# Patient Record
Sex: Female | Born: 1975
Health system: Southern US, Community
[De-identification: ages and names within clinical notes are randomized; demographics above are authoritative.]

## PROBLEM LIST (undated history)

## (undated) DIAGNOSIS — Z8619 Personal history of other infectious and parasitic diseases: Secondary | ICD-10-CM

## (undated) DIAGNOSIS — D649 Anemia, unspecified: Secondary | ICD-10-CM

## (undated) DIAGNOSIS — B3731 Acute candidiasis of vulva and vagina: Secondary | ICD-10-CM

## (undated) DIAGNOSIS — N870 Mild cervical dysplasia: Secondary | ICD-10-CM

## (undated) DIAGNOSIS — N3281 Overactive bladder: Secondary | ICD-10-CM

## (undated) DIAGNOSIS — Z5189 Encounter for other specified aftercare: Secondary | ICD-10-CM

## (undated) DIAGNOSIS — B373 Candidiasis of vulva and vagina: Secondary | ICD-10-CM

## (undated) HISTORY — DX: Acute candidiasis of vulva and vagina: B37.31

## (undated) HISTORY — DX: Anemia, unspecified: D64.9

## (undated) HISTORY — DX: Mild cervical dysplasia: N87.0

## (undated) HISTORY — DX: Candidiasis of vulva and vagina: B37.3

## (undated) HISTORY — DX: Overactive bladder: N32.81

## (undated) HISTORY — PX: OTHER SURGICAL HISTORY: SHX169

## (undated) HISTORY — DX: Personal history of other infectious and parasitic diseases: Z86.19

---

## 2002-09-18 ENCOUNTER — Other Ambulatory Visit: Admission: RE | Admit: 2002-09-18 | Discharge: 2002-09-18 | Payer: Self-pay | Admitting: Obstetrics and Gynecology

## 2003-10-16 ENCOUNTER — Other Ambulatory Visit: Admission: RE | Admit: 2003-10-16 | Discharge: 2003-10-16 | Payer: Self-pay | Admitting: Obstetrics and Gynecology

## 2004-02-03 ENCOUNTER — Emergency Department (HOSPITAL_COMMUNITY): Admission: EM | Admit: 2004-02-03 | Discharge: 2004-02-03 | Payer: Self-pay | Admitting: Emergency Medicine

## 2004-12-09 ENCOUNTER — Other Ambulatory Visit: Admission: RE | Admit: 2004-12-09 | Discharge: 2004-12-09 | Payer: Self-pay | Admitting: Obstetrics and Gynecology

## 2005-12-13 ENCOUNTER — Other Ambulatory Visit: Admission: RE | Admit: 2005-12-13 | Discharge: 2005-12-13 | Payer: Self-pay | Admitting: Obstetrics & Gynecology

## 2007-10-04 ENCOUNTER — Other Ambulatory Visit: Admission: RE | Admit: 2007-10-04 | Discharge: 2007-10-04 | Payer: Self-pay | Admitting: Obstetrics & Gynecology

## 2008-04-04 DIAGNOSIS — Z5189 Encounter for other specified aftercare: Secondary | ICD-10-CM

## 2008-04-04 DIAGNOSIS — IMO0001 Reserved for inherently not codable concepts without codable children: Secondary | ICD-10-CM

## 2008-04-04 HISTORY — DX: Encounter for other specified aftercare: Z51.89

## 2008-04-04 HISTORY — DX: Reserved for inherently not codable concepts without codable children: IMO0001

## 2008-04-17 ENCOUNTER — Inpatient Hospital Stay (HOSPITAL_COMMUNITY): Admission: AD | Admit: 2008-04-17 | Discharge: 2008-04-17 | Payer: Self-pay | Admitting: Obstetrics and Gynecology

## 2008-08-28 ENCOUNTER — Inpatient Hospital Stay (HOSPITAL_COMMUNITY): Admission: AD | Admit: 2008-08-28 | Discharge: 2008-08-28 | Payer: Self-pay | Admitting: Obstetrics and Gynecology

## 2008-11-17 ENCOUNTER — Inpatient Hospital Stay (HOSPITAL_COMMUNITY): Admission: RE | Admit: 2008-11-17 | Discharge: 2008-11-20 | Payer: Self-pay | Admitting: Obstetrics and Gynecology

## 2010-07-10 LAB — CBC
HCT: 20.2 % — ABNORMAL LOW (ref 36.0–46.0)
HCT: 31.9 % — ABNORMAL LOW (ref 36.0–46.0)
Hemoglobin: 6.9 g/dL — CL (ref 12.0–15.0)
MCHC: 34 g/dL (ref 30.0–36.0)
MCHC: 34.2 g/dL (ref 30.0–36.0)
MCHC: 34.5 g/dL (ref 30.0–36.0)
MCHC: 34.5 g/dL (ref 30.0–36.0)
MCV: 94.8 fL (ref 78.0–100.0)
Platelets: 112 10*3/uL — ABNORMAL LOW (ref 150–400)
Platelets: 133 10*3/uL — ABNORMAL LOW (ref 150–400)
Platelets: 96 10*3/uL — ABNORMAL LOW (ref 150–400)
RBC: 3.44 MIL/uL — ABNORMAL LOW (ref 3.87–5.11)
RDW: 15.1 % (ref 11.5–15.5)
RDW: 15.3 % (ref 11.5–15.5)
RDW: 15.4 % (ref 11.5–15.5)
RDW: 15.8 % — ABNORMAL HIGH (ref 11.5–15.5)
RDW: 16 % — ABNORMAL HIGH (ref 11.5–15.5)
WBC: 7.9 10*3/uL (ref 4.0–10.5)

## 2010-07-10 LAB — CROSSMATCH
ABO/RH(D): A NEG
Antibody Screen: POSITIVE

## 2010-07-10 LAB — RH IMMUNE GLOB WKUP(>/=20WKS)(NOT WOMEN'S HOSP): Fetal Screen: NEGATIVE

## 2010-07-10 LAB — RPR: RPR Ser Ql: NONREACTIVE

## 2010-07-13 LAB — RH IMMUNE GLOBULIN WORKUP (NOT WOMEN'S HOSP): ABO/RH(D): A NEG

## 2010-07-19 LAB — RH IMMUNE GLOBULIN WORKUP (NOT WOMEN'S HOSP): Antibody Screen: NEGATIVE

## 2010-08-17 NOTE — H&P (Signed)
[name], [name]              ACCOUNT NO.:  [id number]   MEDICAL RECORD NO.:  [id number]          PATIENT TYPE:  INP   LOCATION:  9164                          [facility]   PHYSICIAN:  [name], Jade.D. DATE OF BIRTH:  04-14-1975   DATE OF ADMISSION:  11/17/2008  DATE OF DISCHARGE:                              HISTORY & PHYSICAL   Ms. Barnes is a 35 year old gravida 2, para 0-0-1-0, at 41-1/7, weeks  who presents for induction secondary to post dates.  Her pregnancy has  been remarkable for:  1. Rh negative.  2. First trimester spotting.  3. Anemia.  4. First trimester UTI.  5. Group B strep negative.   PRENATAL LABORATORY DATA:  Blood type is A negative, Rh antibody is  positive for anti-D.  RPR was nonreactive.  Rubella titer was immune.  Hepatitis B surface antigen was negative.  HIV was nonreactive.  GC,  chlamydia cultures were negative in July.  Pap was normal in July.  Hemoglobin upon entering the practice was 11.7.  It was within normal  limits at 28 weeks.  Her Glucola was normal.  The patient declined  genetic screening.  She had a normal Glucola.  She had negative group B  strep culture and negative GC and chlamydia cultures at 37 weeks.   HISTORY OF PRESENT PREGNANCY:  The patient entered care at approximately  13 weeks.  She had an ultrasound in the first trimester for dating  purposes.  That was in maternity admissions.  She also received RhoGAM  at that time in January for first-trimester spotting.  She declined  genetic screening.  She was treated prophylactically with Macrobid for  some urinary symptoms but the culture was negative.  At 20 weeks she had  an ultrasound showing normal growth and development.  Glucola was given  at 28 weeks and was normal.  She also received RhoGAM at approximately  29 weeks.  She had some spotting at 31 weeks.  This was just subsequent  to her dose of RhoGAM.  She was treated for BV at that time.  Pap,  cultures and  beta strep were done at 36 weeks.  All these were within  normal limits.  The patient was seen in the office on August 13 for a  return visit.  She had discussed induction with [name] 2 days  prior to that and had been scheduled at 41-1/7 weeks.  No slots were  available in the evening for cervical ripening; therefore, she had to be  scheduled of a morning time on August 16.  At the time of her visit on  August 13, her cervix was very posterior, 1 cm about, 70%, and the  vertex was at a -1 station.  Cervix, however, was very firm.   OBSTETRICAL HISTORY:  In 1999 she had a termination of pregnancy.   MEDICAL HISTORY:  She has received RhoGAM with that in the past.  She is  A negative.  She was on Loestrin 24, which she stopped November 2008.  She reports usual childhood illnesses.  She has a history  of anemia and  was on an iron supplement.  She has had no surgeries other than the  previously-noted TAB.   ALLERGIES:  She has no known medication allergies.   FAMILY HISTORY:  Maternal grandmother had a heart attack.  Paternal  grandmother had congestive heart failure.  Paternal grandmother had  emphysema.  Paternal grandfather and maternal grandfather had diabetes.  Paternal grandfather had 2 strokes.  Paternal uncle has leukemia.   GENETIC HISTORY:  Unremarkable.   SOCIAL HISTORY:  The patient is married to the father of the baby.  He  is involved and supportive.  His name is [name].  The patient is  Caucasian, of the [place] faith.  She has a bachelor's degree.  She is  an Education officer, community.  Her husband has a bachelor's degree.  He is a  Therapist, music.  She has been followed by the certified nurse midwife  service of [place].  She denies any alcohol, drug or tobacco  use during this pregnancy.   PHYSICAL EXAMINATION:  VITAL SIGNS: Stable.  The patient is febrile.  HEENT:  Within normal limits.  LUNGS:  Bilateral breath sounds are clear.  HEART:   Regular rate and rhythm without murmur.  BREASTS:  Soft and nontender.  ABDOMEN:  Fundal height is approximately 39 cm.  Estimated fetal weight  is 7-8 pounds.  Uterine contractions are and have been approximately  every 5-7 minutes, mild quality.  Cervical exam on last evaluation was posterior, 1, 75%, vertex at a -1  station, but the cervix was very posterior and very firm.  Fetal heart rate was reactive on the tracing on August 13.  EXTREMITIES:  Deep tendon reflexes are 2+ without clonus.  There is  trace edema noted.   IMPRESSION:  1. Intrauterine pregnancy at 41-1/7 weeks.  2. Post dates.  3. Negative group B strep.  4. Unfavorable cervix.  5. Rh negative.   PLAN:  1. Admit to birthing suite per consult with [name] as      attending physician.  2. Routine certified nurse midwife orders.  3. We will plan cervical ripening today.  The patient understands that      this may be an issue of serial induction requiring cervical      preparation before starting Pitocin.  The patient also seems to      understand, has also had explained to her the issues of induction      which may cause it to be a prolonged labor, failure of method, need      for cesarean section.  The patient and her husband seem to      understand these risks and wish to proceed.  4. We will plan pain medication as labor advances p.r.n.      [name], C.N.Jade.      [name], Jade.D.  Electronically Signed    VLL/MEDQ  D:  11/15/2008  T:  11/15/2008  Job:  [id number]

## 2010-08-17 NOTE — Discharge Summary (Signed)
[name], [name]              ACCOUNT NO.:  [id number]   MEDICAL RECORD NO.:  [id number]          PATIENT TYPE:  INP   LOCATION:  9146                          [facility]   PHYSICIAN:  Crist Fat. Rivard, M.D. DATE OF BIRTH:  07-04-1975   DATE OF ADMISSION:  11/17/2008  DATE OF DISCHARGE:  11/20/2008                               DISCHARGE SUMMARY   ADMITTING DIAGNOSES:  1. Intrauterine pregnancy at 41 weeks.  2. Rh negative.  3. History of mild anemia.   DISCHARGE DIAGNOSES:  1. Intrauterine pregnancy at 41 weeks.  2. Retained placenta.  3. Postpartum anemia with hemodynamic changes.  4. Mild thrombocytopenia.   PROCEDURES:  1. Normal spontaneous vaginal birth.  2. Repair of first-degree laceration.  3. Vigorous fundal massage for retained placenta.  4. Transfusion of 2 units packed red blood cells.   HOSPITAL COURSE:  [name] is a 35 year old gravida 2, para 0-0-1-0 at  41 weeks who presented for induction of labor.  On the morning of November 17, 2008, the patient's pregnancy had been remarkable for  1. Rh negative.  2. First trimester spotting  3. History of anemia.  4. UTI.   The patient's hemoglobin was 11.7 at her new OB visit, it was 10.9 on  the day of admission.  On admission, hemoglobin was 10.9, hematocrit was  31.9, and platelet count was 112.  On the day of admission, plan was  made for cervical ripening secondary to cervix being fairly unfavorable  with cervix at a fingertip 1 cm 50% vertex -2.  Cytotec was placed at  approximately 10:30 a.m. on that morning it was maintained until 10:30  that night.  At that time her cervix was posterior fingertip to 160%  vertex -2.  The decision was made to run Pitocin low-dose subsequently  through the night and then it began increasing at 7:00 a.m.  The patient  began to get more uncomfortable as the night progressed.  Cervix by 2:00  a.m. was 3.5 cm, 90% vertex at -1 station.  Epidural was placed at 4 or  5  a.m.  Cervix was 5.5, 80% vertex at -2.  She had some moderate-to-  large amount of bright red blood at that time.  There were some mild  variables with fetal heart rate was overall reassuring.  Artificial  rupture of membranes was deferred at that time.  It was performed by  6:00 a.m. when cervix was 60-90% vertex at -1.  Fetal heart rate was  again overall reassuring.  There were some mild variables noted.  The  patient progressed to 9 cm at 9:00 a.m., was completely dilated at 9:30  a.m. with vertex at 0 to +1 station.  She labored down until  approximately 12:10, at which time she began to push.  Over the next  hour, she made good progress.  She did have a low-grade temp of 100.2.  The patient accomplished a spontaneous vaginal delivery at 3:07 p.m. of  a female infant, [name], weight 7 pounds 5 ounces.  Apgars were 8 and 9  from a ROP position  with three loops of cord.  Placenta was noted after  1 hour to be retained.  Bleeding was minimal.  Dr. Su Hilt was notified  at 5:05 p.m.  Fundus was just above umbilicus, was firm.  There was no  active bleeding.  Placenta was then removed via vigorous fundal massage.  Estimated blood loss was approximately 1000 mL.  Fundus was firm,  subsequently that her IV was continued.  First-degree laceration was  repaired under local anesthesia.  She was transferred to [facility]  and was returned to essentially routine care.  By the next morning, she  was noting some dizziness and some significant fatigue.  Orthostatics  were done showing positive orthostatic pulse changes, but negative blood  pressure changes.  Hemoglobin on day one was 6.9, which is down from  11.1, platelet count was 96 down from 118, blood transfusion was offered  and reviewed with the patient.  At that time she declined it.  She  continued to have no syncopal episodes and her dizziness resolved.  However, by the morning of November 20, 2008, she was complaining of  extreme fatigue  and increased heart rate with activity.  She was breast-  feeding, but then decided to move to bottle feeding.  Her pulse was 97,  blood pressure is 106/68.  She was appearing pale.  Hemoglobin on day 2  was 6.2, hematocrit was 18, white blood cell count was 11.7, and  platelet count was 112.  Again blood transfusion was offered with risks  and benefits reviewed with the patient, her husband, both by Dr. Estanislado Pandy,  as well as myself.  Patient did elect to proceed with transfusion, 2  units of packed red blood cells were given to the patient with  premedication.  H and H was done 4 hours subsequent to the last unit and  her hemoglobin was 8.5.  Hematocrit was 24.2 and platelet count was 133.  The patient was feeling much better.  She was up ad lib without any  syncope, dizziness, significant excessive fatigue, or tachycardia.  She  did wish to be discharged home on the evening of August 19.  Dr. Estanislado Pandy  and Dr. Su Hilt were consulted due to some cross coverage, both of them  okay, discharged as long as the patient was feeling stable and which she  was.  The patient was then discharged home in stable condition.   DISCHARGE INSTRUCTIONS:  Per [facility] handout, bleeding  precautions were also reviewed with the patient.   DISCHARGE MEDICATIONS:  1. Motrin 600 mg p.o. q.6 h p.r.n. pain.  2. Percocet 5/325 one to two p.o. q.3-4 h p.r.n. pain.  3. Integra F one p.o. daily.   Discharge followup occur in 6 weeks [facility].  The patient  plans [name] at her postpartum visit.      Renaldo Reel Emilee Hero, C.N.M.      Crist Fat Rivard, M.D.  Electronically Signed    VLL/MEDQ  D:  11/20/2008  T:  11/21/2008  Job:  [id number]

## 2010-08-17 NOTE — H&P (Signed)
[name], [name]              ACCOUNT NO.:  [id number]   MEDICAL RECORD NO.:  [id number]          PATIENT TYPE:  INP   LOCATION:  9164                          [facility]   PHYSICIAN:  Naima A. Dillard, M.D. DATE OF BIRTH:  17-Aug-1975   DATE OF ADMISSION:  11/17/2008  DATE OF DISCHARGE:                              HISTORY & PHYSICAL   ADMISSION DIAGNOSES:  Intrauterine pregnancy at 41 weeks, induction of  labor per the patient's request.  She is Rh negative, history of anemia,  and she is a primigravida.  A 35 year old gravida 2, para 1, AB 1  presents to labor and delivery for induction of labor for postdate,  presented to [facility] at 13-3/7th weeks for beginning of  prenatal care.  She is taking vitamins.  Ultrasound equals dating the  dates.  Placenta was anterior.  RhoGAM was given early in the pregnancy  due to spotting in the first trimester, as well as 30 weeks in MAU due  to Rh negative.  She had a history of an urinary tract infection with  this pregnancy, which was treated with Macrobid, presents to the office,  8/11, requesting to be induced due to work issues and no insurance/time  off, scheduled for November 17, 2008, at 41 weeks was told to do fetal  kick counts and return to labor and delivery if labor ensues.  The  patient and husband were very adamant about having an induction after  prolonged conversations about the risks of induction including  postpartum hemorrhage or C-section and states that they understand that  this could be a possibility and wants to proceed with the induction on  Monday.   LABORATORY DATA:  She is A negative, positive antibody screen.  She is a  A negative antibody immune, VDRL nonreactive, rubella immune, HBAg  negative, HIV nonreactive, Pap negative.  CTGC and CT were negative and  GBS was negative.   PAST MEDICAL HISTORY:  She is A negative.  She had spotting in the first  trimester and she was given RhoGAM.   Chickenpox as a child, history of  anemia.  Denies asthma, epilepsy, or heart disease.   PAST SURGICAL HISTORY:  Negative.   PAST OBSTETRICAL AND GYNECOLOGICAL HISTORY:  Menarche at 35 years old  with irregular cycles from 7-12 days.  She was on birth control pills to  regulate her periods.  Her last menstrual period was January 07, 2008,  which equals an [facility] of 8 per dates and ultrasound.  In 1999, she had  elective AB.   SOCIAL HISTORY:  The patient works as activities assistance full time.  She has her bachelor's degree.  She is married to her husband, Casimiro Needle  who works as a Therapist, music.  He has a bachelor's degree.  Husband as 2  sons by previous marriage.  The patient denies smoking, alcohol, or  drugs.   FAMILY HISTORY:  Maternal grandmother has emphysema.  Paternal  grandfather had two strokes, paternal uncle has leukemia, maternal  grandmother has heart disease, and congestive heart failure.   REVIEW OF SYSTEMS:  At office visit denies headaches, epigastric pain,  baby moving.  HEART:  Regular rate without murmur.  LUNGS:  Clear  bilaterally.  ABDOMEN:  Gravid.  Estimated fetal weight 7-1/2 to 8  pounds.  VAGINAL EXAM:  Posterior cervix, -1 to -2 vertex, negative  Homans.  Reflexes were +1.   ASSESSMENT:  Intrauterine pregnancy at 41 weeks, induction of labor for  postdates.  Rh negative, GBS negative.   PLAN:  Admit to labor and delivery for induction per protocol.  Protocols were faxed for induction.  Bishop score was 6.  Dr. Normand Sloop  was notified per voice mail as she is the doctor on call, as well as a  Statistician.      [name], CNM      Naima A. Normand Sloop, M.D.  Electronically Signed    JM/MEDQ  D:  11/14/2008  T:  11/15/2008  Job:  [id number]

## 2010-12-10 ENCOUNTER — Other Ambulatory Visit: Payer: Self-pay | Admitting: Obstetrics and Gynecology

## 2010-12-13 ENCOUNTER — Encounter (HOSPITAL_COMMUNITY): Payer: Self-pay | Admitting: *Deleted

## 2010-12-14 NOTE — H&P (Signed)
NAME:  [name], [name] NO.:  [id number]  MEDICAL RECORD NO.:  [id number]  LOCATION:  PERIO                         [facility]ILITY[facility]  PHYSICIAN:  Osborn Coho, M.D.   DATE OF BIRTH:  1975-08-31  DATE OF ADMISSION:  12/10/2010 DATE OF DISCHARGE:                             HISTORY & PHYSICAL   HISTORY OF PRESENT ILLNESS:  Ms. Kramme is a 36 year old married white female, para 1-0-1-1 presenting for cold knife conization of her cervix because of adenocarcinoma in situ and CIN-I .  The patient gives a history of normal Pap smears until August 2012 when her Pap smear returned showing epithelial cell abnormality with glandular cells and endocervical adenocarcinoma in situ.  A subsequent colposcopy returned with biopsy results showing  CIN-I with focal fragments of atypical endocervical epithelium consistent with endocervical adenocarcinoma in situ.  The patient denies any intermenstrual bleeding, postcoital bleeding, vaginitis symptoms, or abnormal menstrual cycle.  The patient's menstrual flow lasts for 3 days requiring only two pads per day and she denies any cramping.  Given the current findings on colposcopy/biopsy, the patient has consented to proceed with cold knife conization of her cervix.  PAST MEDICAL HISTORY:  OB history:  Gravida 2, para 1-0-1-1.  The patient had a spontaneous vaginal birth in 2010 for which she underwent a blood transfusion.  GYN history:  Menarche 35 years old.  Last menstrual period on November 17, 2010.  She uses Loestrin 24 as her method of contraception.  She does have recently diagnosed human papillomavirus.  Her Pap smears have been normal until recently - see history of present illness.  Medical history:  Anemia.  Surgical history:  Negative.  FAMILY HISTORY:  Cardiovascular disease, diabetes mellitus, emphysema, stroke, and leukemia.  SOCIAL HISTORY:  The patient is married and she works as an Teacher, English as a foreign language at  [name].  HABITS:  She does not use tobacco, alcohol, or illicit drugs.  CURRENT MEDICATIONS:  Multivitamins daily, Loestrin 24 Fe daily.  ALLERGIES:  She has no known drug allergies.  She denies any sensitivities to latex, peanuts, shellfish, or soy  REVIEW OF SYSTEMS:  The patient does wear glasses.  She denies any headache, vision changes, tinnitus, difficulty swallowing, chronic cough, chest pain, shortness of breath, nausea, vomiting, diarrhea, constipation, dysuria, hematuria, urinary urgency, urinary frequency, vaginitis symptoms, myalgias, arthralgia, or night sweats.  PHYSICAL EXAMINATION:  VITAL SIGNS:  Blood pressure 118/70, pulse is 72, respirations 16, temperature 98.5 degrees Fahrenheit orally, weight is 130 pounds, and height 5 feet and 5-1/4 inches tall.  Body mass index is 22.5. NECK:  Supple without masses.  There is no thyromegaly or cervical adenopathy. HEART:  Regular rate and rhythm. LUNGS:  Clear. BACK:  No CVA tenderness. ABDOMEN:  No tenderness, guarding, rebound, masses, or organomegaly. EXTREMITIES:  No clubbing, cyanosis, or edema. PELVIC:  EGBUS is normal.  Vagina is normal.  Cervix is nontender without lesions.  Uterus appears normal size, shape, and consistency without tenderness.  Adnexa without tenderness or masses.  IMPRESSION: 1. Cervical adenocarcinoma in situ. 2. CIN-I.  DISPOSITION:  A discussion was held with the patient regarding the indications for her procedure along with its risks  which include but are not limited to reaction to anesthesia, damage to adjacent organs, infection, excessive bleeding, cervical incompetence, and cervical stenosis.  The patient verbalized understanding of these risks and has consented to proceed with a cold knife conization on December 15, 2010, at 9 o'clock a.m. at [facility].     Rahsaan Weakland J. Lowell Guitar, P.A.-C.   ______________________________ Osborn Coho,  M.D.    EJP/MEDQ  D:  12/13/2010  T:  12/14/2010  Job:  [id number]

## 2010-12-15 ENCOUNTER — Encounter (HOSPITAL_COMMUNITY): Payer: Self-pay | Admitting: Anesthesiology

## 2010-12-15 ENCOUNTER — Ambulatory Visit (HOSPITAL_COMMUNITY): Payer: PRIVATE HEALTH INSURANCE | Admitting: Anesthesiology

## 2010-12-15 ENCOUNTER — Other Ambulatory Visit: Payer: Self-pay | Admitting: Obstetrics and Gynecology

## 2010-12-15 ENCOUNTER — Ambulatory Visit (HOSPITAL_COMMUNITY)
Admission: RE | Admit: 2010-12-15 | Discharge: 2010-12-15 | Disposition: A | Discharge status: Home / Self Care | Payer: PRIVATE HEALTH INSURANCE | Source: Ambulatory Visit | Attending: Obstetrics and Gynecology | Admitting: Obstetrics and Gynecology

## 2010-12-15 ENCOUNTER — Encounter (HOSPITAL_COMMUNITY)
Admission: RE | Disposition: A | Discharge status: Home / Self Care | Payer: Self-pay | Source: Ambulatory Visit | Attending: Obstetrics and Gynecology

## 2010-12-15 DIAGNOSIS — D069 Carcinoma in situ of cervix, unspecified: Secondary | ICD-10-CM | POA: Insufficient documentation

## 2010-12-15 HISTORY — DX: Encounter for other specified aftercare: Z51.89

## 2010-12-15 HISTORY — PX: CERVICAL CONIZATION W/BX: SHX1330

## 2010-12-15 LAB — CBC
HCT: 39 % (ref 36.0–46.0)
MCHC: 34.1 g/dL (ref 30.0–36.0)
RDW: 14 % (ref 11.5–15.5)

## 2010-12-15 SURGERY — CONE BIOPSY, CERVIX
Anesthesia: Choice | Site: Vagina | Wound class: Clean Contaminated

## 2010-12-15 MED ORDER — KETOROLAC TROMETHAMINE 30 MG/ML IJ SOLN
INTRAMUSCULAR | Status: AC
  Filled 2010-12-15: qty 1

## 2010-12-15 MED ORDER — MIDAZOLAM HCL 2 MG/2ML IJ SOLN
INTRAMUSCULAR | Status: AC
  Filled 2010-12-15: qty 2

## 2010-12-15 MED ORDER — DEXAMETHASONE SODIUM PHOSPHATE 4 MG/ML IJ SOLN
INTRAMUSCULAR | Status: DC | PRN
  Administered 2010-12-15: 10 mg via INTRAVENOUS

## 2010-12-15 MED ORDER — VASOPRESSIN 20 UNIT/ML IJ SOLN
INTRAVENOUS | Status: DC | PRN
  Administered 2010-12-15: 10:00:00 via INTRAMUSCULAR

## 2010-12-15 MED ORDER — LIDOCAINE HCL (CARDIAC) 20 MG/ML IV SOLN
INTRAVENOUS | Status: DC | PRN
  Administered 2010-12-15: 50 mg via INTRAVENOUS

## 2010-12-15 MED ORDER — LACTATED RINGERS IV SOLN
INTRAVENOUS | Status: DC
  Administered 2010-12-15: 10:00:00 via INTRAVENOUS
  Administered 2010-12-15: 125 mL/h via INTRAVENOUS
  Administered 2010-12-15: 08:00:00 via INTRAVENOUS

## 2010-12-15 MED ORDER — ONDANSETRON HCL 4 MG/2ML IJ SOLN
INTRAMUSCULAR | Status: DC | PRN
  Administered 2010-12-15: 4 mg via INTRAVENOUS

## 2010-12-15 MED ORDER — HYDROCODONE-ACETAMINOPHEN 5-325 MG PO TABS: 1.0000 | ORAL_TABLET | Freq: Once | ORAL | Status: DC

## 2010-12-15 MED ORDER — PROPOFOL 10 MG/ML IV EMUL
INTRAVENOUS | Status: AC
  Filled 2010-12-15: qty 20

## 2010-12-15 MED ORDER — HYDROCODONE-ACETAMINOPHEN 5-325 MG PO TABS
ORAL_TABLET | ORAL | Status: AC
  Administered 2010-12-15: 1
  Filled 2010-12-15: qty 1

## 2010-12-15 MED ORDER — LIDOCAINE HCL 1 % IJ SOLN
INTRAMUSCULAR | Status: DC | PRN
  Administered 2010-12-15: 10 mL

## 2010-12-15 MED ORDER — GLYCOPYRROLATE 0.2 MG/ML IJ SOLN
INTRAMUSCULAR | Status: AC
  Filled 2010-12-15: qty 1

## 2010-12-15 MED ORDER — FENTANYL CITRATE 0.05 MG/ML IJ SOLN
INTRAMUSCULAR | Status: AC
  Filled 2010-12-15: qty 5

## 2010-12-15 MED ORDER — PROPOFOL 10 MG/ML IV EMUL
INTRAVENOUS | Status: DC | PRN
  Administered 2010-12-15: 150 mg via INTRAVENOUS

## 2010-12-15 MED ORDER — ONDANSETRON HCL 4 MG/2ML IJ SOLN
INTRAMUSCULAR | Status: AC
  Filled 2010-12-15: qty 2

## 2010-12-15 MED ORDER — FENTANYL CITRATE 0.05 MG/ML IJ SOLN
INTRAMUSCULAR | Status: AC
  Filled 2010-12-15: qty 2

## 2010-12-15 MED ORDER — IBUPROFEN 600 MG PO TABS: 600.0000 mg | ORAL_TABLET | Freq: Four times a day (QID) | ORAL | Status: AC | PRN

## 2010-12-15 MED ORDER — HYDROCODONE-ACETAMINOPHEN 5-500 MG PO TABS: 1.0000 | ORAL_TABLET | Freq: Four times a day (QID) | ORAL | Status: AC | PRN

## 2010-12-15 MED ORDER — ACETIC ACID 4% SOLUTION
Status: DC | PRN
  Administered 2010-12-15: 1 via TOPICAL

## 2010-12-15 MED ORDER — DEXAMETHASONE SODIUM PHOSPHATE 10 MG/ML IJ SOLN
INTRAMUSCULAR | Status: AC
  Filled 2010-12-15: qty 1

## 2010-12-15 MED ORDER — GLYCOPYRROLATE 0.2 MG/ML IJ SOLN
INTRAMUSCULAR | Status: DC | PRN
  Administered 2010-12-15: 0.2 mg via INTRAVENOUS

## 2010-12-15 MED ORDER — KETOROLAC TROMETHAMINE 30 MG/ML IJ SOLN
INTRAMUSCULAR | Status: DC | PRN
  Administered 2010-12-15: 30 mg via INTRAVENOUS

## 2010-12-15 MED ORDER — LIDOCAINE HCL (CARDIAC) 20 MG/ML IV SOLN
INTRAVENOUS | Status: AC
  Filled 2010-12-15: qty 5

## 2010-12-15 MED ORDER — MIDAZOLAM HCL 5 MG/5ML IJ SOLN
INTRAMUSCULAR | Status: DC | PRN
  Administered 2010-12-15: 2 mg via INTRAVENOUS

## 2010-12-15 MED ORDER — FERRIC SUBSULFATE SOLN
Status: DC | PRN
  Administered 2010-12-15: 1

## 2010-12-15 MED ORDER — FENTANYL CITRATE 0.05 MG/ML IJ SOLN
INTRAMUSCULAR | Status: DC | PRN
  Administered 2010-12-15 (×3): 50 ug via INTRAVENOUS

## 2010-12-15 SURGICAL SUPPLY — 30 items
APPLICATOR COTTON TIP 6IN STRL (MISCELLANEOUS) ×6 IMPLANT
BLADE SURG 11 STRL SS (BLADE) ×2 IMPLANT
CLOTH BEACON ORANGE TIMEOUT ST (SAFETY) ×2 IMPLANT
CONTAINER PREFILL 10% NBF 60ML (FORM) ×2 IMPLANT
COUNTER NEEDLE 1200 MAGNETIC (NEEDLE) IMPLANT
DRAPE UTILITY XL STRL (DRAPES) ×2 IMPLANT
ELECT BALL LEEP 5MM RED (ELECTRODE) ×2 IMPLANT
ELECT REM PT RETURN 9FT ADLT (ELECTROSURGICAL)
ELECTRODE REM PT RTRN 9FT ADLT (ELECTROSURGICAL) IMPLANT
GAUZE SPONGE 4X4 16PLY XRAY LF (GAUZE/BANDAGES/DRESSINGS) IMPLANT
GLOVE BIO SURGEON STRL SZ7.5 (GLOVE) ×4 IMPLANT
GLOVE BIOGEL PI IND STRL 7.5 (GLOVE) ×1 IMPLANT
GLOVE BIOGEL PI INDICATOR 7.5 (GLOVE) ×1
GOWN PREVENTION PLUS LG XLONG (DISPOSABLE) ×4 IMPLANT
GOWN STRL REIN XL XLG (GOWN DISPOSABLE) ×2 IMPLANT
NDL SPNL 22GX3.5 QUINCKE BK (NEEDLE) ×1 IMPLANT
NEEDLE SPNL 22GX3.5 QUINCKE BK (NEEDLE) ×2 IMPLANT
PACK VAGINAL MINOR WOMEN LF (CUSTOM PROCEDURE TRAY) ×2 IMPLANT
PENCIL BUTTON HOLSTER BLD 10FT (ELECTRODE) ×2 IMPLANT
SCOPETTES 8  STERILE (MISCELLANEOUS) ×2
SCOPETTES 8 STERILE (MISCELLANEOUS) ×2 IMPLANT
SPONGE SURGIFOAM ABS GEL 12-7 (HEMOSTASIS) IMPLANT
SUT VIC AB 0 CT1 18XCR BRD8 (SUTURE) ×1 IMPLANT
SUT VIC AB 0 CT1 8-18 (SUTURE) ×2
SYR CONTROL 10ML LL (SYRINGE) ×4 IMPLANT
SYR TB 1ML 27GX1/2 SAFE (SYRINGE) ×1 IMPLANT
SYR TB 1ML 27GX1/2 SAFETY (SYRINGE) ×2
TOWEL OR 17X24 6PK STRL BLUE (TOWEL DISPOSABLE) ×4 IMPLANT
TUBING NON-CON 1/4 X 20 CONN (TUBING) ×2 IMPLANT
YANKAUER SUCT BULB TIP NO VENT (SUCTIONS) ×2 IMPLANT

## 2010-12-15 NOTE — H&P (Signed)
[name] is an 35 y.o. female G2P1 with Adenocarcinoma in-situ on pap and initial Colpo with biopsies showing CIN 1 on 3 specimens and negative on one.  I asked for the specimens to be reviewed and upon review, an area on the initially diagnosed neg bx specimen showed atypical endocervical epithelium c/w endocervical AIS.  Pt and husband informed and will proceed as scheduled with [facility].  Pertinent Gynecological History: Menses: flow is light Bleeding: wnl Contraception: OCP (estrogen/progesterone) DES exposure: unknown Sexually transmitted diseases: no past history Previous GYN Procedures: none  Last mammogram: none Last pap: abnormal: Endocervical AIS Date: 11/17/10 OB History: G2, P1 (h/o ETOP x 1)   Menstrual History: Menarche age: h/o regular menses, denies h/o infection Patient's last menstrual period was 11/17/2010.    Past Medical History  Diagnosis Date  . [facility]    Past Surgical History  Procedure Date  . Wisdom teethextraction     History reviewed. No pertinent family history.  Social History:  reports that she has never smoked. She does not have any smokeless tobacco history on file. She reports that she does not use illicit drugs. Her alcohol history not on file.  Allergies: No Known Allergies  Prescriptions prior to admission  Medication Sig Dispense Refill  . Multiple Vitamins-Calcium (ONE-A-DAY WOMENS FORMULA PO) Take 1 tablet by mouth daily.        . Norethin Ace-Eth Estrad-FE (LOESTRIN 24 FE PO) Take 1 tablet by mouth daily.          Review of Systems  Constitutional: Negative.   HENT: Negative.   Eyes: Negative.   Respiratory: Negative.   Cardiovascular: Negative.   Gastrointestinal: Negative.   Genitourinary: Negative.   Musculoskeletal: Negative.   Skin: Negative.   Neurological: Negative.   Endo/Heme/Allergies: Negative.   Psychiatric/Behavioral: Negative.     Blood pressure 125/80,  pulse 76, temperature 98.2 F (36.8 C), temperature source Oral, resp. rate 18, last menstrual period 11/17/2010, SpO2 100.00%. Physical Exam  Constitutional: She appears well-developed and well-nourished.  HENT:  Head: Normocephalic.  Neck: Normal range of motion. Neck supple.  Cardiovascular: Normal rate and regular rhythm.   Respiratory: Effort normal and breath sounds normal.  GI: Soft. She exhibits no distension and no mass. There is no tenderness. There is no rebound and no guarding.  Musculoskeletal: Normal range of motion.  Neurological: She is alert.  Skin: Skin is warm and dry.    Results for orders placed during the hospital encounter of 12/15/10 (from the past 24 hour(s))  CBC     Status: Normal   Collection Time   12/15/10  7:50 AM      Component Value Range   WBC 5.1  4.0 - 10.5 (K/uL)   RBC 4.31  3.87 - 5.11 (MIL/uL)   Hemoglobin 13.3  12.0 - 15.0 (g/dL)   HCT 04.5  40.9 - 81.1 (%)   MCV 90.5  78.0 - 100.0 (fL)   MCH 30.9  26.0 - 34.0 (pg)   MCHC 34.1  30.0 - 36.0 (g/dL)   RDW 91.4  78.2 - 95.6 (%)   Platelets 218  150 - 400 (K/uL)  HCG, SERUM, QUALITATIVE     Status: Normal   Collection Time   12/15/10  7:50 AM      Component Value Range   Preg, Serum NEGATIVE  NEGATIVE     No results found.  Assessment/Plan: 35 y.o. female G2P1 with Adenocarcinoma in-situ on  pap and initial Colpo with biopsies showing CIN 1 on 3 specimens and negative on one.  I asked for the specimens to be reviewed and upon review, an area on the initially diagnosed neg bx specimen showed atypical endocervical epithelium c/w endocervical AIS.  Pt and husband informed and will proceed as scheduled with [facility].  Risks Benefits and Alternatives reviewed including but not limited to bleeding, infection, injury.  Questions answered.   Desmond Szabo Y 12/15/2010, 9:15 AM

## 2010-12-15 NOTE — Transfer of Care (Signed)
Immediate Anesthesia Transfer of Care Note  Patient: [name]  Procedure(s) Performed:  CONIZATION CERVIX WITH BIOPSY  Patient Location: PACU  Anesthesia Type: General  Level of Consciousness: awake, alert  and sedated  Airway & Oxygen Therapy: Patient Spontanous Breathing and Patient connected to nasal cannula oxygen  Post-op Assessment: Report given to PACU RN and Post -op Vital signs reviewed and stable  Post vital signs: Reviewed and stable  Complications: No apparent anesthesia complications

## 2010-12-15 NOTE — Anesthesia Postprocedure Evaluation (Signed)
Anesthesia Post Note  Patient: [name]  Procedure(s) Performed:  CONIZATION CERVIX WITH BIOPSY  Anesthesia type: GA  Patient location: PACU  Post pain: Pain level controlled  Post assessment: Post-op Vital signs reviewed  Last Vitals:  Filed Vitals:   12/15/10 1115  BP:   Pulse: 74  Temp:   Resp: 20    Post vital signs: Reviewed  Level of consciousness: sedated  Complications: No apparent anesthesia complications

## 2010-12-15 NOTE — Op Note (Signed)
Preop Diagnosis: Adenocarcinoma In Situ; CIN 1   Postop Diagnosis: Adenocarcinoma In Situ; CIN 1   Procedure: CONIZATION CERVIX WITH BIOPSY   Anesthesia: General via LMA   Anesthesiologist: Velna Hatchet   Attending: Purcell Nails, MD   Assistant: n/a  Findings: AW around entire TZ  Pathology: Cervical Cone Specimen tagged at 12 O'Clock.  Fluids: 1200cc  UOP: 100cc  EBL: Minimal  Complications: None  Procedure: The patient was taken to the operating room after the risks, benefits and alternatives were discussed with the patient. The patient verbalized understanding and consent signed and witnessed. The patient was placed under general anesthesia per anesthesiologist and prepped and draped in the normal sterile fashion. A TIME OUT was performed per protocol.  A bivalve speculum was placed in the patient's vagina and 2 stitches were placed, one at the 3:00 position and one at the 9:00 position. Colposcopy was performed and there was an acetowhite lesion around the entire transformation zone.  A total of 10 cc of dilute Pitressin was injected in cervix circumferentially. There was 20 units of Pitressin in 50 cc of normal saline. Cold knife cone was performed without difficulty and a stitch placed at 12:00 and specimen sent to pathology. The base of the cervix was cauterized with ball-tipped cautery. Monsel solution was placed to aid hemostasis. There was still a small amount of oozing noted and Gelfoam placed to assure hemostasis. All instruments were removed. Sponge lap and needle count was correct. Patient tolerated the procedure well and was awaiting transfer to the recovery room in good condition.

## 2010-12-15 NOTE — Anesthesia Preprocedure Evaluation (Signed)
Anesthesia Evaluation  Name, MR# and DOB Patient awake  General Assessment Comment  Reviewed: Allergy & Precautions, H&P , Patient's Chart, lab work & pertinent test results, reviewed documented beta blocker date and time   History of Anesthesia Complications Negative for: history of anesthetic complications  Airway Mallampati: II TM Distance: >3 FB Neck ROM: full    Dental No notable dental hx.    Pulmonary  clear to auscultation  pulmonary exam normalPulmonary Exam Normal breath sounds clear to auscultation none    Cardiovascular Exercise Tolerance: Good regular Normal    Neuro/Psych Negative Neurological ROS  Negative Psych ROS  GI/Hepatic/Renal negative GI ROS  negative Liver ROS  negative Renal ROS        Endo/Other  Negative Endocrine ROS (+)      Abdominal   Musculoskeletal   Hematology negative hematology ROS (+)   Peds  Reproductive/Obstetrics negative OB ROS    Anesthesia Other Findings             Anesthesia Physical Anesthesia Plan  ASA: I  Anesthesia Plan: General   Post-op Pain Management:    Induction:   Airway Management Planned:   Additional Equipment:   Intra-op Plan:   Post-operative Plan:   Informed Consent: I have reviewed the patients History and Physical, chart, labs and discussed the procedure including the risks, benefits and alternatives for the proposed anesthesia with the patient or authorized representative who has indicated his/her understanding and acceptance.   Dental Advisory Given  Plan Discussed with: CRNA and Surgeon  Anesthesia Plan Comments:         Anesthesia Quick Evaluation

## 2010-12-16 ENCOUNTER — Encounter (HOSPITAL_COMMUNITY): Payer: Self-pay | Admitting: Obstetrics and Gynecology

## 2011-09-14 ENCOUNTER — Encounter: Payer: Self-pay | Admitting: Obstetrics and Gynecology

## 2011-09-14 ENCOUNTER — Ambulatory Visit (INDEPENDENT_AMBULATORY_CARE_PROVIDER_SITE_OTHER): Payer: PRIVATE HEALTH INSURANCE | Admitting: Obstetrics and Gynecology

## 2011-09-14 VITALS — BP 102/62 | Ht 65.0 in | Wt 132.0 lb

## 2011-09-14 DIAGNOSIS — Z124 Encounter for screening for malignant neoplasm of cervix: Secondary | ICD-10-CM

## 2011-09-14 DIAGNOSIS — D069 Carcinoma in situ of cervix, unspecified: Secondary | ICD-10-CM

## 2011-09-14 MED ORDER — NORETHIN ACE-ETH ESTRAD-FE 1-20 MG-MCG(24) PO TABS: 1.0000 | ORAL_TABLET | Freq: Every day | ORAL | Status: DC

## 2011-09-14 NOTE — Progress Notes (Signed)
Moving to [place] to parents passed away within the last year.  Plans to still come here secondary to works in the area. Filed Vitals:   09/14/11 1609  BP: 102/62   Ext genitalia wnl Vagina wnl Cervical Os slightly challenging to locate secondary to h/o CKC  A/P Repeat pap in 4-6 mths with AEX Refill Loestrin 24 pt request

## 2011-09-28 ENCOUNTER — Telehealth: Payer: Self-pay

## 2011-09-28 NOTE — Telephone Encounter (Signed)
Spoke to [name] to notify of her abnl pap. Dr Su Hilt wants to do EBX and colpo asap. [name] was booked for  appt on 10/20/2011. Melody Comas A

## 2011-10-20 ENCOUNTER — Telehealth: Payer: Self-pay | Admitting: Obstetrics and Gynecology

## 2011-10-20 ENCOUNTER — Ambulatory Visit (INDEPENDENT_AMBULATORY_CARE_PROVIDER_SITE_OTHER): Payer: PRIVATE HEALTH INSURANCE | Admitting: Obstetrics and Gynecology

## 2011-10-20 ENCOUNTER — Encounter: Payer: Self-pay | Admitting: Obstetrics and Gynecology

## 2011-10-20 VITALS — BP 120/60 | Ht 65.0 in | Wt 129.0 lb

## 2011-10-20 DIAGNOSIS — IMO0002 Reserved for concepts with insufficient information to code with codable children: Secondary | ICD-10-CM | POA: Insufficient documentation

## 2011-10-20 DIAGNOSIS — Z139 Encounter for screening, unspecified: Secondary | ICD-10-CM

## 2011-10-20 DIAGNOSIS — R87619 Unspecified abnormal cytological findings in specimens from cervix uteri: Secondary | ICD-10-CM

## 2011-10-20 DIAGNOSIS — R6889 Other general symptoms and signs: Secondary | ICD-10-CM

## 2011-10-20 LAB — POCT URINE PREGNANCY: Preg Test, Ur: NEGATIVE

## 2011-10-20 NOTE — Progress Notes (Signed)
  Previous Pap Smear: 0612/2013/ AGC/ AGCUS Previous Colposcopy: yes Referred From: N/A LMP: 09/17/2011 Contraception: none G,P: G2P1 (h/o vaginal delivery)  No complaints.  Pt informed of results of pap and recs.  Filed Vitals:   10/20/11 1010  BP: 120/60   colpo performed per protocol.  Paracervical block performed per pt request - 10cc 1% lidocaine TZ not well visualized secondary to h/o CKC for AIS with neg margins Bxs done at 12, 3, 6 and 9 O'Clock Em Bx done per protocol.  Pipelle passed x 3 to 8 cm.  A/P Lengthy discussion with pt about recommendation for hysterectomy.  Pt and husband have discussed this since last time and they have decided to have no more children and are agreeable to hysterectomy.  The type of hysterectomy to be confirmed once bx results return.  If cancer will need gyn onc involved for BSO and Nodes.  Otherwise rec, [facility].  Pt says however she may want BSO also, will discuss at that time. RTO in 1wk to go over results and discuss hysterectomy further Questions answered visit

## 2011-10-25 LAB — PATHOLOGY

## 2011-10-26 ENCOUNTER — Ambulatory Visit (INDEPENDENT_AMBULATORY_CARE_PROVIDER_SITE_OTHER): Payer: PRIVATE HEALTH INSURANCE | Admitting: Obstetrics and Gynecology

## 2011-10-26 ENCOUNTER — Encounter: Payer: Self-pay | Admitting: Obstetrics and Gynecology

## 2011-10-26 VITALS — BP 132/64 | Resp 16 | Ht 65.0 in | Wt 129.0 lb

## 2011-10-26 DIAGNOSIS — D069 Carcinoma in situ of cervix, unspecified: Secondary | ICD-10-CM

## 2011-10-26 DIAGNOSIS — IMO0002 Reserved for concepts with insufficient information to code with codable children: Secondary | ICD-10-CM

## 2011-10-26 DIAGNOSIS — R87619 Unspecified abnormal cytological findings in specimens from cervix uteri: Secondary | ICD-10-CM

## 2011-10-26 DIAGNOSIS — N870 Mild cervical dysplasia: Secondary | ICD-10-CM

## 2011-10-26 NOTE — Progress Notes (Signed)
Follow up to visit 10/20/2011 colposcopy results. Mw,cma  Filed Vitals:   10/26/11 0857  BP: 132/64  Resp: 16   Office Visit on 10/20/2011  Component Date Value Range Status  . Preg Test, Ur 10/20/2011 Negative   Final  . Report 10/20/2011    Final   Comment: FINAL DIAGNOSIS:                          A.  Endometrium - Biopsy:                                 Small fragments of benign endometrial lining and small fragments of squamous mucosa                               with focal koilocytotic atypia.                                 Negative for definitive dysplasia.                                                           B.  Endocervix - Curettage:                                 Fragment of CIN I and fragments of benign endocervical mucosa with reactive                          epithelial      changes.                                 See comment.                            C.  Cervix- Biopsy, 3 o'clock:                                  Squamous mucosa with focal koilocytotic atypia.                               Negative for definitive dysplasia.                               See comment.                            D.  Cervix- Biopsy, 9 o'clock:                                 Squamous mucosa with focal koilocytotic atypia.  Negative for definitive dysplasia.                               See comment.                                                       E.  Cervix- Biopsy, 12 o'clock:                                 Focal low grade squamous intraepithelial lesion (LSIL), mild dysplasia and HPV                          infection,      CIN I.                                 See comment.                            F.  Cervix- Biopsy, 6 o'clock:                                 Focal low grade squamous intraepithelial lesion (LSIL), mild dysplasia and HPV                          infection,      CIN I.                                 See comment.                          COMMENT:                          The above findings are associated with the patient's previous pap test ([id number], [employer]).                            Intradepartmental review:  JH.                           Daleen Bo, MD, FCAP                          Electronically Signed                          CLINICAL HISTORY:                          [phone]  SOURCE OF SPECIMEN:                          A: Endometrium - Biopsy                          B: Endocervix - Curettage                          C: Cervix- Biopsy, 3 o'clock                          D: Cervix- Biopsy, 9 o'clock                          E: Cervix- Biopsy, 12 o'clock                          F: Cervix- Biopsy, 6 o'clock                          GROSS DESCRIPTION:                          Six parts:                          A.  Received in a formalin filled container labeled with the patient's name and "EMBX" is                          a 0.8 x 0.4 x 0.1 cm aggregate of multiple minute red-tan soft tissue fragments and blood                          clot.  Entirely submitted in cassette A.  AC/dmg                          B.  Received in a formalin filled container labeled with the patient's name and "ECC" is a                          0.5 x 0.3 x 0.1 cm aggregate of multiple minute red-tan soft tissue fragments and mucus.                           Entirely submitted in cassette B.   AC/dmg                          C.  Received in a formalin filled container labeled with the patient's name and "3" is a                          0.3 x 0.3 x 0.3 cm tan-pink soft tissue fragment.  Entirely submitted in cassette C.                           AC/dmg  D.  Received in a formalin filled container labeled with the patient's name and "9" is a                          0.3 x 0.3 x 0.3 cm tan-pink soft tissue fragment.   Entirely submitted in cassette D.                            AC/dmg                          E.  Received in a formalin filled container labeled with the patient's name and "12" is a                          0.4 x 0.3 x 0.3 cm tan-pink soft tissue fragment.  Entirely submitted in cassette E.                           AC/dmg                          F.  Received in a formalin filled container labeled with the patient's name and "6" is a                          0.3 x 0.3 x 0.3 cm tan-pink soft tissue fragment.  Entirely submitted in cassette F.                           AC/dmg   A/P Results reviewed CIN I.  Options and recs discussed. Pt wants to proceed with hysterectomy secondary to AIS history and will keep ovaries. Lengthy discussion about the r/b/a of removing ovaries. Pt will keep them for now. Pt interested in baseline mammo secondary to history and strong family hx of cancer but not specifically breast cancer. Pt says surgery is already scheduled for 11/30/11 Will need u/s at preop appt with EP

## 2011-11-02 ENCOUNTER — Telehealth: Payer: Self-pay | Admitting: Obstetrics and Gynecology

## 2011-11-02 NOTE — Telephone Encounter (Signed)
TL[facility]/Cystoscopy scheduled for 11/30/11 @ 12;30 with AR/ND.  [employer] eff  02/02/09.  Plan pays 80/20 after a $1,500 deductible. Pre-op due $339.75. -[name]

## 2011-11-02 NOTE — OR Nursing (Signed)
Due to changing Dr Orvan July time to three  Hours  This case was also affected Gust Eugene time given to Adrianne

## 2011-11-03 ENCOUNTER — Other Ambulatory Visit: Payer: Self-pay | Admitting: Obstetrics and Gynecology

## 2011-11-07 ENCOUNTER — Ambulatory Visit (INDEPENDENT_AMBULATORY_CARE_PROVIDER_SITE_OTHER): Payer: PRIVATE HEALTH INSURANCE | Admitting: Obstetrics and Gynecology

## 2011-11-07 ENCOUNTER — Other Ambulatory Visit: Payer: Self-pay | Admitting: Obstetrics and Gynecology

## 2011-11-07 ENCOUNTER — Ambulatory Visit (INDEPENDENT_AMBULATORY_CARE_PROVIDER_SITE_OTHER): Payer: PRIVATE HEALTH INSURANCE

## 2011-11-07 ENCOUNTER — Encounter: Payer: Self-pay | Admitting: Obstetrics and Gynecology

## 2011-11-07 VITALS — BP 120/80 | HR 64 | Temp 96.5°F | Resp 16 | Ht 65.0 in | Wt 131.0 lb

## 2011-11-07 DIAGNOSIS — D069 Carcinoma in situ of cervix, unspecified: Secondary | ICD-10-CM

## 2011-11-07 DIAGNOSIS — N870 Mild cervical dysplasia: Secondary | ICD-10-CM

## 2011-11-07 DIAGNOSIS — R87619 Unspecified abnormal cytological findings in specimens from cervix uteri: Secondary | ICD-10-CM

## 2011-11-07 DIAGNOSIS — Z01818 Encounter for other preprocedural examination: Secondary | ICD-10-CM

## 2011-11-07 NOTE — Progress Notes (Signed)
[name] is a 36 y.o. female G2P0011 who presents for a total laparoscopic hysterectomy because of a history of adenocarcinoma in situ of the cervix and subsequent CIN-I.  Patient had  normal PAP smears until August 2012 when her PAP smear returned showing epithelial cell abnormality with glandular cells and endocervical adenocarcinoma in situ. A subsequent colposcopy returned with the biopsy results showing capsule CIN-I with focal fragments of atypical endocervical epithelium consistent with endocervical adenocarcinoma in situ.  She underwent a cold knife conization (12/2010) that returned showing adenocarcinoma in situ with no evidence of invasive carcinoma or involvement of surgical margins of resection. A follow up PAP smear in February 2013 was normal but PAP smear in June 2013 showed atypical glandular cells not otherwise specified. The colposcopy with biopsy that followed revealed CIN-I.  Patient denies any intermenstrual or post-coital bleeding, pelvic pain, urinary tract symptoms or changes in bowel movements.   Pelvic U/S 11/2011 showed: uterus-6.01 x 4.52 x 2.80 cm;  right ovary-2.51 x 1.38 x 1.65 cm and left ovary-2.89 x 1.74 x 2.10 cm .  A review of medical and surgical management options were given to the patient  regarding management of her cervical abnormalities however, given her  recent history she has decided to proceed with definitive therapy in the form of hysterectomy.    Past Medical History  OB History: G2P0011 SVB in 2010  GYN History: menarche  36 YO   LMP 10/18/2011    Contracepton condoms   recent diagnosis of the human papilloma virus.   Medical History: anemia  Surgical History:  2012 Cold Knife Conization Denies problems with anesthesia .  Had a blood transfusion follow ing childbirth.  Family History:  cardiovascular disease, asthma, emphysema, lung cancer-father, brain cancer-mother, diabetes and stroke  Social History:   Married, employed as an Physicist, medical in an assisted living facility; denies tobacco or illicit drug use but                              drinks alcohol socially    Outpatient Encounter Prescriptions as of 11/07/2011  Medication Sig Dispense Refill  . Multiple Vitamins-Calcium (ONE-A-DAY WOMENS FORMULA PO) Take 1 tablet by mouth daily.        . Norethindrone Acetate-Ethinyl Estrad-FE (LOESTRIN 24 FE) 1-20 MG-MCG(24) tablet Take 1 tablet by mouth daily.  1 Package  5  no longer taking oral contraceptives  No Known Allergies  Denies sensitivity to latex, soy, shellfish peanuts or adhesives  ROS: Admits to glasses/contact lenses;  Denies headache, vision changes, dysphagia, tinnitus, dizziness,  chest pain, shortness of breath, nausea, vomiting, diarrhea, dysuria, hematuria, pelvic pain, swelling of joints,easy bruising,  myalgias, arthralgias, skin rashes and except as is mentioned in the history of present illness, patient's review of systems is otherwise negative   Physical Exam    BP 120/80  Pulse 64  Temp 96.5 F (35.8 C)  Resp 16  Ht 5\' 5"  (1.651 m)  Wt 131 lb (59.421 kg)  BMI 21.80 kg/m2  LMP 10/18/2011  Neck: supple without masses or thyromegaly Lungs: clear to auscultation Heart: regular rate and rhythm Abdomen: soft, non-tender and no organomegaly Pelvic:EGBUS- wnl; vagina-normal rugae; uterus-normal size, cervix without lesions or motion tenderness; adnexae-no tenderness or masses Extremities:  no clubbing, cyanosis or edema   Assesment:  History of Adenocarcinoma in situ  CIN-I                          Disposition:  A discussion was held with patient regarding the indication for her procedure(s) along with the risks, which include but are not limited to: reaction to anesthesia, damage to adjacent organs, infection,  excessive bleeding, early menopause and pelvic prolapse.  A Miralax bowel prep was given to be completed twenty-four hours before the surgery. Patient verbalized  understanding of her risks and preoperative instructions and has consented to proceed with a Total Laparoscopic Hysterectomy with Cystoscopy at [facility], November 30, 2011 at 12:30 p.m.   CSN# [id number]   Wandell Scullion J. Lowell Guitar, PA-C  for Dr. Woodroe Mode. Su Hilt

## 2011-11-07 NOTE — Progress Notes (Signed)
Pre-op

## 2011-11-07 NOTE — Progress Notes (Deleted)
Last Pap: *** WNL: {yes no free text:20080::"Yes"} Regular Periods:{yes no:314532} Contraception: ***  Monthly Breast exam:{yes no:314532} Tetanus<71yrs:{yes no:314532} Nl.Bladder Function:{yes no:314532} Daily BMs:{yes no:314532} Healthy Diet:{yes no:314532} Calcium:{yes no:314532} Mammogram:{yes no:314532} Date of Mammogram: *** Exercise:{YES NO:22349} Have often Exercise: *** Seatbelt: {yes no:314532} Abuse at home: {yes no:314532} Stressful work:{yes no:314532} Sigmoid-colonoscopy: *** Bone Density: {EXAM; YES/NO:19492::"No"} PCP: *** Change in PMH: *** Change in [facility]:***

## 2011-11-08 NOTE — H&P (Signed)
[name] is a 36 y.o. female G2P0011 who presents for a total laparoscopic hysterectomy because of a history of adenocarcinoma in situ of the cervix and subsequent CIN-I.  Patient had  normal PAP smears until August 2012 when her PAP smear returned showing epithelial cell abnormality with glandular cells and endocervical adenocarcinoma in situ. A subsequent colposcopy returned with the biopsy results showing capsule CIN-I with focal fragments of atypical endocervical epithelium consistent with endocervical adenocarcinoma in situ.  She underwent a cold knife conization (12/2010) that returned showing adenocarcinoma in situ with no evidence of invasive carcinoma or involvement of surgical margins of resection. A follow up PAP smear in February 2013 was normal but PAP smear in June 2013 showed atypical glandular cells not otherwise specified. The colposcopy with biopsy that followed revealed CIN-I.  Patient denies any intermenstrual or post-coital bleeding, pelvic pain, urinary tract symptoms or changes in bowel movements.   Pelvic U/S 11/2011 showed: uterus-6.01 x 4.52 x 2.80 cm;  right ovary-2.51 x 1.38 x 1.65 cm and left ovary-2.89 x 1.74 x 2.10 cm .  A review of medical and surgical management options were given to the patient  regarding management of her cervical abnormalities however, given her  recent history she has decided to proceed with definitive therapy in the form of hysterectomy.    Past Medical History  OB History: G2P0011 SVB in 2010  GYN History: menarche  36 YO   LMP 10/18/2011    Contracepton condoms   recent diagnosis of the human papilloma virus.   Medical History: anemia  Surgical History:  2012 Cold Knife Conization Denies problems with anesthesia .  Had a blood transfusion follow ing childbirth.  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 Multiple Vitamins-Calcium (ONE-A-DAY WOMENS FORMULA PO) Take 1 tablet by mouth daily.        . Norethindrone Acetate-Ethinyl Estrad-FE (LOESTRIN 24 FE) 1-20 MG-MCG(24) tablet Take 1 tablet by mouth daily.  1 Package  5  no longer taking oral contraceptives  No Known Allergies  Denies sensitivity to latex, soy, shellfish peanuts or adhesives  ROS: Admits to glasses/contact lenses;  Denies headache, vision changes, dysphagia, tinnitus, dizziness,  chest pain, shortness of breath, nausea, vomiting, diarrhea, dysuria, hematuria, pelvic pain, swelling of joints,easy bruising,  myalgias, arthralgias, skin rashes and except as is mentioned in the history of present illness, patient's review of systems is otherwise negative   Physical Exam    BP 120/80  Pulse 64  Temp 96.5 F (35.8 C)  Resp 16  Ht 5' 5" (1.651 m)  Wt 131 lb (59.421 kg)  BMI 21.80 kg/m2  LMP 10/18/2011  Neck: supple without masses or thyromegaly Lungs: clear to auscultation Heart: regular rate and rhythm Abdomen: soft, non-tender and no organomegaly Pelvic:EGBUS- wnl; vagina-normal rugae; uterus-normal size, cervix without lesions or motion tenderness; adnexae-no tenderness or masses Extremities:  no clubbing, cyanosis or edema   Assesment:  History of Adenocarcinoma in situ                          CIN-I                          Disposition:  A discussion was held with patient regarding the indication for her procedure(s) along with the risks, which include but are not limited to: reaction to anesthesia, damage to adjacent organs, infection,  excessive bleeding, early menopause and pelvic prolapse.  A Miralax bowel prep was given to be completed twenty-four hours before the surgery. Patient verbalized  understanding of her risks and preoperative instructions and has consented to proceed with a Total Laparoscopic Hysterectomy with Cystoscopy at [facility], November 30, 2011 at 12:30 p.m.   CSN# [id number]   Jade Candella J. Weslie Pretlow, PA-C  for Dr. Angela Y. Roberts   

## 2011-11-15 ENCOUNTER — Telehealth: Payer: Self-pay | Admitting: Obstetrics and Gynecology

## 2011-11-16 ENCOUNTER — Telehealth: Payer: Self-pay

## 2011-11-16 NOTE — Telephone Encounter (Signed)
LM for pt to cb re: message she left about pelvic pain. I gave her my direct extention. Melody Comas A

## 2011-11-18 ENCOUNTER — Encounter (HOSPITAL_COMMUNITY): Payer: Self-pay | Admitting: Pharmacist

## 2011-11-24 ENCOUNTER — Inpatient Hospital Stay (HOSPITAL_COMMUNITY): Admission: RE | Admit: 2011-11-24 | Payer: PRIVATE HEALTH INSURANCE | Source: Ambulatory Visit

## 2011-11-28 ENCOUNTER — Encounter (HOSPITAL_COMMUNITY): Payer: Self-pay

## 2011-11-28 ENCOUNTER — Encounter (HOSPITAL_COMMUNITY)
Admission: RE | Admit: 2011-11-28 | Discharge: 2011-11-28 | Disposition: A | Discharge status: Home / Self Care | Payer: PRIVATE HEALTH INSURANCE | Source: Ambulatory Visit | Attending: Obstetrics and Gynecology | Admitting: Obstetrics and Gynecology

## 2011-11-28 LAB — SURGICAL PCR SCREEN
MRSA, PCR: NEGATIVE
Staphylococcus aureus: NEGATIVE

## 2011-11-28 LAB — CBC
Hemoglobin: 13.2 g/dL (ref 12.0–15.0)
Platelets: 192 10*3/uL (ref 150–400)
RBC: 4.36 MIL/uL (ref 3.87–5.11)
WBC: 5.2 10*3/uL (ref 4.0–10.5)

## 2011-11-28 NOTE — Patient Instructions (Addendum)
20 [name]  11/28/2011   Your procedure is scheduled on:  11/30/11  Enter through the Main Entrance of [facility] at 1130 AM.  Pick up the phone at the desk and dial [phone].   Call this number if you have problems the morning of surgery: [phone]   Remember:   Do not eat food:After Midnight.  Do not drink clear liquids: after 9AM day of surgery  Take these medicines the morning of surgery with A SIP OF WATER: NA   Do not wear jewelry, make-up or nail polish.  Do not wear lotions, powders, or perfumes. You may wear deodorant.  Do not shave 48 hours prior to surgery.  Do not bring valuables to the hospital.  Contacts, dentures or bridgework may not be worn into surgery.  Leave suitcase in the car. After surgery it may be brought to your room.  For patients admitted to the hospital, checkout time is 11:00 AM the day of discharge.   Patients discharged the day of surgery will not be allowed to drive home.  Name and phone number of your driver: NA  Special Instructions: CHG Shower Use Special Wash: 1/2 bottle night before surgery and 1/2 bottle morning of surgery.   Please read over the following fact sheets that you were given: MRSA Information

## 2011-11-30 ENCOUNTER — Encounter (HOSPITAL_COMMUNITY): Payer: Self-pay | Admitting: Anesthesiology

## 2011-11-30 ENCOUNTER — Ambulatory Visit (HOSPITAL_COMMUNITY): Payer: PRIVATE HEALTH INSURANCE | Admitting: Anesthesiology

## 2011-11-30 ENCOUNTER — Encounter (HOSPITAL_COMMUNITY)
Admission: RE | Disposition: A | Discharge status: Home / Self Care | Payer: Self-pay | Source: Ambulatory Visit | Attending: Obstetrics and Gynecology

## 2011-11-30 ENCOUNTER — Ambulatory Visit (HOSPITAL_COMMUNITY)
Admission: RE | Admit: 2011-11-30 | Discharge: 2011-12-01 | Disposition: A | Discharge status: Home / Self Care | Payer: PRIVATE HEALTH INSURANCE | Source: Ambulatory Visit | Attending: Obstetrics and Gynecology | Admitting: Obstetrics and Gynecology

## 2011-11-30 ENCOUNTER — Encounter (HOSPITAL_COMMUNITY): Payer: Self-pay

## 2011-11-30 ENCOUNTER — Encounter (HOSPITAL_COMMUNITY): Payer: Self-pay | Admitting: *Deleted

## 2011-11-30 DIAGNOSIS — Z90710 Acquired absence of both cervix and uterus: Secondary | ICD-10-CM

## 2011-11-30 DIAGNOSIS — Z01818 Encounter for other preprocedural examination: Secondary | ICD-10-CM | POA: Insufficient documentation

## 2011-11-30 DIAGNOSIS — D069 Carcinoma in situ of cervix, unspecified: Secondary | ICD-10-CM

## 2011-11-30 DIAGNOSIS — Z01812 Encounter for preprocedural laboratory examination: Secondary | ICD-10-CM | POA: Insufficient documentation

## 2011-11-30 DIAGNOSIS — N838 Other noninflammatory disorders of ovary, fallopian tube and broad ligament: Secondary | ICD-10-CM | POA: Insufficient documentation

## 2011-11-30 DIAGNOSIS — Z9889 Other specified postprocedural states: Secondary | ICD-10-CM

## 2011-11-30 HISTORY — PX: LAPAROSCOPIC HYSTERECTOMY: SHX1926

## 2011-11-30 SURGERY — HYSTERECTOMY, TOTAL, LAPAROSCOPIC
Anesthesia: General | Site: Abdomen | Wound class: Clean Contaminated

## 2011-11-30 MED ORDER — KETOROLAC TROMETHAMINE 30 MG/ML IJ SOLN: 15.0000 mg | Freq: Once | INTRAMUSCULAR | Status: DC | PRN

## 2011-11-30 MED ORDER — GLYCOPYRROLATE 0.2 MG/ML IJ SOLN
INTRAMUSCULAR | Status: AC
  Filled 2011-11-30: qty 1

## 2011-11-30 MED ORDER — NEOSTIGMINE METHYLSULFATE 1 MG/ML IJ SOLN
INTRAMUSCULAR | Status: AC
  Filled 2011-11-30: qty 10

## 2011-11-30 MED ORDER — PROMETHAZINE HCL 25 MG/ML IJ SOLN: 6.2500 mg | INTRAMUSCULAR | Status: DC | PRN

## 2011-11-30 MED ORDER — LACTATED RINGERS IV BOLUS (SEPSIS)
500.0000 mL | Freq: Once | INTRAVENOUS | Status: AC
  Administered 2011-11-30: 500 mL via INTRAVENOUS

## 2011-11-30 MED ORDER — OXYCODONE-ACETAMINOPHEN 5-325 MG PO TABS
1.0000 | ORAL_TABLET | ORAL | Status: DC | PRN
  Administered 2011-12-01: 2 via ORAL
  Filled 2011-11-30: qty 2

## 2011-11-30 MED ORDER — ROCURONIUM BROMIDE 100 MG/10ML IV SOLN
INTRAVENOUS | Status: DC | PRN
  Administered 2011-11-30: 5 mg via INTRAVENOUS
  Administered 2011-11-30: 20 mg via INTRAVENOUS
  Administered 2011-11-30: 10 mg via INTRAVENOUS
  Administered 2011-11-30: 35 mg via INTRAVENOUS

## 2011-11-30 MED ORDER — FENTANYL CITRATE 0.05 MG/ML IJ SOLN
INTRAMUSCULAR | Status: AC
  Administered 2011-11-30: 50 ug via INTRAVENOUS
  Filled 2011-11-30: qty 2

## 2011-11-30 MED ORDER — INDIGOTINDISULFONATE SODIUM 8 MG/ML IJ SOLN
INTRAMUSCULAR | Status: DC | PRN
  Administered 2011-11-30: 5 mL via INTRAVENOUS

## 2011-11-30 MED ORDER — LACTATED RINGERS IV SOLN
INTRAVENOUS | Status: DC
  Administered 2011-11-30 (×3): via INTRAVENOUS

## 2011-11-30 MED ORDER — ONDANSETRON HCL 4 MG/2ML IJ SOLN
INTRAMUSCULAR | Status: DC | PRN
  Administered 2011-11-30: 4 mg via INTRAVENOUS

## 2011-11-30 MED ORDER — KETOROLAC TROMETHAMINE 30 MG/ML IJ SOLN: 30.0000 mg | Freq: Four times a day (QID) | INTRAMUSCULAR | Status: DC

## 2011-11-30 MED ORDER — FENTANYL CITRATE 0.05 MG/ML IJ SOLN
INTRAMUSCULAR | Status: AC
  Filled 2011-11-30: qty 5

## 2011-11-30 MED ORDER — ONDANSETRON HCL 4 MG/2ML IJ SOLN
INTRAMUSCULAR | Status: AC
  Filled 2011-11-30: qty 2

## 2011-11-30 MED ORDER — MIDAZOLAM HCL 2 MG/2ML IJ SOLN
INTRAMUSCULAR | Status: AC
  Filled 2011-11-30: qty 2

## 2011-11-30 MED ORDER — MEPERIDINE HCL 25 MG/ML IJ SOLN: 6.2500 mg | INTRAMUSCULAR | Status: DC | PRN

## 2011-11-30 MED ORDER — DEXAMETHASONE SODIUM PHOSPHATE 10 MG/ML IJ SOLN
INTRAMUSCULAR | Status: AC
  Filled 2011-11-30: qty 1

## 2011-11-30 MED ORDER — BUPIVACAINE HCL (PF) 0.25 % IJ SOLN
INTRAMUSCULAR | Status: DC | PRN
  Administered 2011-11-30: 15 mL
  Administered 2011-11-30: 10 mL

## 2011-11-30 MED ORDER — PROPOFOL 10 MG/ML IV EMUL
INTRAVENOUS | Status: DC | PRN
  Administered 2011-11-30: 150 mg via INTRAVENOUS

## 2011-11-30 MED ORDER — ONDANSETRON HCL 4 MG/2ML IJ SOLN: 4.0000 mg | Freq: Four times a day (QID) | INTRAMUSCULAR | Status: DC | PRN

## 2011-11-30 MED ORDER — BUPIVACAINE HCL (PF) 0.25 % IJ SOLN
INTRAMUSCULAR | Status: AC
  Filled 2011-11-30: qty 30

## 2011-11-30 MED ORDER — MIDAZOLAM HCL 2 MG/2ML IJ SOLN: 0.5000 mg | Freq: Once | INTRAMUSCULAR | Status: DC | PRN

## 2011-11-30 MED ORDER — LIDOCAINE HCL (CARDIAC) 20 MG/ML IV SOLN
INTRAVENOUS | Status: AC
  Filled 2011-11-30: qty 5

## 2011-11-30 MED ORDER — GLYCOPYRROLATE 0.2 MG/ML IJ SOLN
INTRAMUSCULAR | Status: DC | PRN
  Administered 2011-11-30: .6 mg via INTRAVENOUS

## 2011-11-30 MED ORDER — FENTANYL CITRATE 0.05 MG/ML IJ SOLN
INTRAMUSCULAR | Status: DC | PRN
  Administered 2011-11-30: 50 ug via INTRAVENOUS
  Administered 2011-11-30 (×2): 100 ug via INTRAVENOUS

## 2011-11-30 MED ORDER — LACTATED RINGERS IV SOLN
INTRAVENOUS | Status: DC
  Administered 2011-11-30 (×2): via INTRAVENOUS

## 2011-11-30 MED ORDER — HYDROMORPHONE 0.3 MG/ML IV SOLN
INTRAVENOUS | Status: DC
  Administered 2011-11-30: 0.4 mg via INTRAVENOUS
  Administered 2011-11-30: 17:00:00 via INTRAVENOUS
  Administered 2011-12-01: 0.4 mg via INTRAVENOUS
  Filled 2011-11-30: qty 25

## 2011-11-30 MED ORDER — MIDAZOLAM HCL 5 MG/5ML IJ SOLN
INTRAMUSCULAR | Status: DC | PRN
  Administered 2011-11-30: 2 mg via INTRAVENOUS

## 2011-11-30 MED ORDER — LACTATED RINGERS IR SOLN
Status: DC | PRN
  Administered 2011-11-30: 3000 mL

## 2011-11-30 MED ORDER — NEOSTIGMINE METHYLSULFATE 1 MG/ML IJ SOLN
INTRAMUSCULAR | Status: DC | PRN
  Administered 2011-11-30: 3 mg via INTRAVENOUS

## 2011-11-30 MED ORDER — FENTANYL CITRATE 0.05 MG/ML IJ SOLN
25.0000 ug | INTRAMUSCULAR | Status: DC | PRN
  Administered 2011-11-30 (×4): 50 ug via INTRAVENOUS

## 2011-11-30 MED ORDER — SODIUM CHLORIDE 0.9 % IJ SOLN: 9.0000 mL | INTRAMUSCULAR | Status: DC | PRN

## 2011-11-30 MED ORDER — LIDOCAINE HCL (CARDIAC) 20 MG/ML IV SOLN
INTRAVENOUS | Status: DC | PRN
  Administered 2011-11-30: 50 mg via INTRAVENOUS

## 2011-11-30 MED ORDER — INDIGOTINDISULFONATE SODIUM 8 MG/ML IJ SOLN
INTRAMUSCULAR | Status: AC
  Filled 2011-11-30: qty 5

## 2011-11-30 MED ORDER — METOCLOPRAMIDE HCL 5 MG/ML IJ SOLN
10.0000 mg | Freq: Four times a day (QID) | INTRAMUSCULAR | Status: AC
  Administered 2011-11-30 – 2011-12-01 (×3): 10 mg via INTRAVENOUS
  Filled 2011-11-30 (×3): qty 2

## 2011-11-30 MED ORDER — DIPHENHYDRAMINE HCL 50 MG/ML IJ SOLN: 12.5000 mg | Freq: Four times a day (QID) | INTRAMUSCULAR | Status: DC | PRN

## 2011-11-30 MED ORDER — PROPOFOL 10 MG/ML IV EMUL
INTRAVENOUS | Status: AC
  Filled 2011-11-30: qty 20

## 2011-11-30 MED ORDER — NALOXONE HCL 0.4 MG/ML IJ SOLN: 0.4000 mg | INTRAMUSCULAR | Status: DC | PRN

## 2011-11-30 MED ORDER — CEFAZOLIN SODIUM-DEXTROSE 2-3 GM-% IV SOLR
INTRAVENOUS | Status: AC
  Filled 2011-11-30: qty 50

## 2011-11-30 MED ORDER — DIPHENHYDRAMINE HCL 12.5 MG/5ML PO ELIX: 12.5000 mg | ORAL_SOLUTION | Freq: Four times a day (QID) | ORAL | Status: DC | PRN

## 2011-11-30 MED ORDER — IBUPROFEN 600 MG PO TABS: 600.0000 mg | ORAL_TABLET | Freq: Four times a day (QID) | ORAL | Status: DC | PRN

## 2011-11-30 MED ORDER — DEXAMETHASONE SODIUM PHOSPHATE 4 MG/ML IJ SOLN
INTRAMUSCULAR | Status: DC | PRN
  Administered 2011-11-30: 10 mg via INTRAVENOUS

## 2011-11-30 MED ORDER — CEFAZOLIN SODIUM-DEXTROSE 2-3 GM-% IV SOLR
2.0000 g | INTRAVENOUS | Status: AC
  Administered 2011-11-30: 2 g via INTRAVENOUS

## 2011-11-30 SURGICAL SUPPLY — 45 items
ADH SKN CLS APL DERMABOND .7 (GAUZE/BANDAGES/DRESSINGS) ×1
BARRIER ADHS 3X4 INTERCEED (GAUZE/BANDAGES/DRESSINGS) IMPLANT
BRR ADH 4X3 ABS CNTRL BYND (GAUZE/BANDAGES/DRESSINGS)
CHLORAPREP W/TINT 26ML (MISCELLANEOUS) ×2 IMPLANT
CLOTH BEACON ORANGE TIMEOUT ST (SAFETY) ×2 IMPLANT
COVER MAYO STAND STRL (DRAPES) ×2 IMPLANT
DERMABOND ADVANCED (GAUZE/BANDAGES/DRESSINGS) ×1
DERMABOND ADVANCED .7 DNX12 (GAUZE/BANDAGES/DRESSINGS) ×1 IMPLANT
DISSECTOR BLUNT TIP ENDO 5MM (MISCELLANEOUS) IMPLANT
DRAPE HYSTEROSCOPY (DRAPE) ×2 IMPLANT
EVACUATOR SMOKE 8.L (FILTER) ×4 IMPLANT
GLOVE BIO SURGEON STRL SZ7.5 (GLOVE) ×4 IMPLANT
GLOVE BIOGEL PI IND STRL 7.5 (GLOVE) ×1 IMPLANT
GLOVE BIOGEL PI INDICATOR 7.5 (GLOVE) ×1
GOWN PREVENTION PLUS LG XLONG (DISPOSABLE) ×4 IMPLANT
HEMOSTAT SURGICEL 2X14 (HEMOSTASIS) IMPLANT
NDL INSUFFLATION 14GA 120MM (NEEDLE) ×1 IMPLANT
NEEDLE INSUFFLATION 14GA 120MM (NEEDLE) ×2 IMPLANT
NS IRRIG 1000ML POUR BTL (IV SOLUTION) ×2 IMPLANT
OCCLUDER COLPOPNEUMO (BALLOONS) ×2 IMPLANT
PACK LAPAROSCOPY BASIN (CUSTOM PROCEDURE TRAY) ×2 IMPLANT
SCALPEL HARMONIC ACE (MISCELLANEOUS) ×1 IMPLANT
SCISSORS LAP 5X35 DISP (ENDOMECHANICALS) IMPLANT
SET CYSTO W/LG BORE CLAMP LF (SET/KITS/TRAYS/PACK) ×2 IMPLANT
SET IRRIG TUBING LAPAROSCOPIC (IRRIGATION / IRRIGATOR) ×2 IMPLANT
SLEEVE Z-THREAD 5X100MM (TROCAR) ×4 IMPLANT
SOLUTION ELECTROLUBE (MISCELLANEOUS) ×1 IMPLANT
STRIP CLOSURE SKIN 1/4X4 (GAUZE/BANDAGES/DRESSINGS) IMPLANT
SUT MNCRL AB 3-0 PS2 27 (SUTURE) ×6 IMPLANT
SUT PDS AB 1 CT1 36 (SUTURE) IMPLANT
SUT VICRYL 0 TIES 12 18 (SUTURE) IMPLANT
SUT VICRYL 0 UR6 27IN ABS (SUTURE) ×4 IMPLANT
SYR 50ML LL SCALE MARK (SYRINGE) ×2 IMPLANT
TIP UTERINE 5.1X6CM LAV DISP (MISCELLANEOUS) IMPLANT
TIP UTERINE 6.7X10CM GRN DISP (MISCELLANEOUS) IMPLANT
TIP UTERINE 6.7X6CM WHT DISP (MISCELLANEOUS) IMPLANT
TIP UTERINE 6.7X8CM BLUE DISP (MISCELLANEOUS) IMPLANT
TOWEL OR 17X24 6PK STRL BLUE (TOWEL DISPOSABLE) ×4 IMPLANT
TRAY FOLEY CATH 14FR (SET/KITS/TRAYS/PACK) ×2 IMPLANT
TROCAR BALLN 12MMX100 BLUNT (TROCAR) IMPLANT
TROCAR Z-THREAD FIOS 11X100 BL (TROCAR) ×4 IMPLANT
TROCAR Z-THREAD FIOS 5X100MM (TROCAR) ×2 IMPLANT
TUBING FILTER THERMOFLATOR (ELECTROSURGICAL) ×2 IMPLANT
WARMER LAPAROSCOPE (MISCELLANEOUS) ×2 IMPLANT
WATER STERILE IRR 1000ML POUR (IV SOLUTION) ×1 IMPLANT

## 2011-11-30 NOTE — H&P (View-Only) (Signed)
[name] is a 36 y.o. female G2P0011 who presents for a total laparoscopic hysterectomy because of a history of adenocarcinoma in situ of the cervix and subsequent CIN-I.  Patient had  normal PAP smears until August 2012 when her PAP smear returned showing epithelial cell abnormality with glandular cells and endocervical adenocarcinoma in situ. A subsequent colposcopy returned with the biopsy results showing capsule CIN-I with focal fragments of atypical endocervical epithelium consistent with endocervical adenocarcinoma in situ.  She underwent a cold knife conization (12/2010) that returned showing adenocarcinoma in situ with no evidence of invasive carcinoma or involvement of surgical margins of resection. A follow up PAP smear in February 2013 was normal but PAP smear in June 2013 showed atypical glandular cells not otherwise specified. The colposcopy with biopsy that followed revealed CIN-I.  Patient denies any intermenstrual or post-coital bleeding, pelvic pain, urinary tract symptoms or changes in bowel movements.   Pelvic U/S 11/2011 showed: uterus-6.01 x 4.52 x 2.80 cm;  right ovary-2.51 x 1.38 x 1.65 cm and left ovary-2.89 x 1.74 x 2.10 cm .  A review of medical and surgical management options were given to the patient  regarding management of her cervical abnormalities however, given her  recent history she has decided to proceed with definitive therapy in the form of hysterectomy.    Past Medical History  OB History: G2P0011 SVB in 2010  GYN History: menarche  36 YO   LMP 10/18/2011    Contracepton condoms   recent diagnosis of the human papilloma virus.   Medical History: anemia  Surgical History:  2012 Cold Knife Conization Denies problems with anesthesia .  Had a blood transfusion follow ing childbirth.  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 Multiple Vitamins-Calcium (ONE-A-DAY WOMENS FORMULA PO) Take 1 tablet by mouth daily.        . Norethindrone Acetate-Ethinyl Estrad-FE (LOESTRIN 24 FE) 1-20 MG-MCG(24) tablet Take 1 tablet by mouth daily.  1 Package  5  no longer taking oral contraceptives  No Known Allergies  Denies sensitivity to latex, soy, shellfish peanuts or adhesives  ROS: Admits to glasses/contact lenses;  Denies headache, vision changes, dysphagia, tinnitus, dizziness,  chest pain, shortness of breath, nausea, vomiting, diarrhea, dysuria, hematuria, pelvic pain, swelling of joints,easy bruising,  myalgias, arthralgias, skin rashes and except as is mentioned in the history of present illness, patient's review of systems is otherwise negative   Physical Exam    BP 120/80  Pulse 64  Temp 96.5 F (35.8 C)  Resp 16  Ht 5' 5" (1.651 m)  Wt 131 lb (59.421 kg)  BMI 21.80 kg/m2  LMP 10/18/2011  Neck: supple without masses or thyromegaly Lungs: clear to auscultation Heart: regular rate and rhythm Abdomen: soft, non-tender and no organomegaly Pelvic:EGBUS- wnl; vagina-normal rugae; uterus-normal size, cervix without lesions or motion tenderness; adnexae-no tenderness or masses Extremities:  no clubbing, cyanosis or edema   Assesment:  History of Adenocarcinoma in situ                          CIN-I                          Disposition:  A discussion was held with patient regarding the indication for her procedure(s) along with the risks, which include but are not limited to: reaction to anesthesia, damage to adjacent organs, infection,  excessive bleeding, early menopause and pelvic prolapse.  A Miralax bowel prep was given to be completed twenty-four hours before the surgery. Patient verbalized  understanding of her risks and preoperative instructions and has consented to proceed with a Total Laparoscopic Hysterectomy with Cystoscopy at [facility], November 30, 2011 at 12:30 p.m.   CSN# [id number]   Ezechiel Stooksbury J. Ayren Zumbro, PA-C  for Dr. Angela Y. Roberts   

## 2011-11-30 NOTE — Interval H&P Note (Signed)
History and Physical Interval Note:  11/30/2011 12:44 PM  [name]  has presented today for surgery, with the diagnosis of History of AIS with AGUS  The various methods of treatment have been discussed with the patient and family. After consideration of risks, benefits and other options for treatment, the patient has consented to  Procedure(s) (LRB): HYSTERECTOMY TOTAL LAPAROSCOPIC (N/A) as a surgical intervention .  The patient's history has been reviewed, patient examined, no change in status, stable for surgery.  I have reviewed the patient's chart and labs.  Questions were answered to the patient's satisfaction.     Purcell Nails

## 2011-11-30 NOTE — Anesthesia Preprocedure Evaluation (Signed)
Anesthesia Evaluation  Patient identified by MRN, date of birth, ID band Patient awake    Reviewed: Allergy & Precautions, H&P , Patient's Chart, lab work & pertinent test results, reviewed documented beta blocker date and time   History of Anesthesia Complications Negative for: history of anesthetic complications  Airway Mallampati: II TM Distance: >3 FB Neck ROM: full    Dental No notable dental hx.    Pulmonary neg pulmonary ROS,  breath sounds clear to auscultation  Pulmonary exam normal       Cardiovascular Exercise Tolerance: Good negative cardio ROS  Rhythm:regular Rate:Normal     Neuro/Psych negative neurological ROS  negative psych ROS   GI/Hepatic negative GI ROS, Neg liver ROS,   Endo/Other  negative endocrine ROS  Renal/GU negative Renal ROS     Musculoskeletal   Abdominal   Peds  Hematology negative hematology ROS (+)   Anesthesia Other Findings [facility] History of chicken pox        Anemia     Yeast vaginitis        Dysplasia of cervix, low grade (CIN 1)    Reproductive/Obstetrics negative OB ROS                           Anesthesia Physical Anesthesia Plan  ASA: II  Anesthesia Plan: General ETT   Post-op Pain Management:    Induction:   Airway Management Planned:   Additional Equipment:   Intra-op Plan:   Post-operative Plan:   Informed Consent: I have reviewed the patients History and Physical, chart, labs and discussed the procedure including the risks, benefits and alternatives for the proposed anesthesia with the patient or authorized representative who has indicated his/her understanding and acceptance.   Dental Advisory Given  Plan Discussed with: CRNA and Surgeon  Anesthesia Plan Comments:         Anesthesia Quick Evaluation

## 2011-11-30 NOTE — Progress Notes (Signed)
Day of Surgery Procedure(s) (LRB): HYSTERECTOMY TOTAL LAPAROSCOPIC (N/A)  Subjective: Patient reports no complaints.  Wants to eat.    Objective: I have reviewed patient's vital signs and intake and output.  General: alert and no distress Resp: clear to auscultation bilaterally Cardio: regular rate and rhythm GI: soft, app tender, dec BS, incisions c/d/i with dermabond Extremities: extremities normal, atraumatic, no cyanosis or edema Vaginal Bleeding: none  Assessment: s/p Procedure(s) (LRB): HYSTERECTOMY TOTAL LAPAROSCOPIC (N/A): stable Borderline UOP  Plan: Advance diet 500cc LR bolus Place urometer on foley bag SCDs for DVT proplylaxis Encourage IS Cont routine post op care Reglan   LOS: 0 days    [name] Y 11/30/2011, 7:17 PM

## 2011-11-30 NOTE — Anesthesia Postprocedure Evaluation (Signed)
Anesthesia Post Note  Patient: [name]  Procedure(s) Performed: Procedure(s) (LRB): HYSTERECTOMY TOTAL LAPAROSCOPIC (N/A)  Anesthesia type: General  Patient location: PACU  Post pain: Pain level controlled  Post assessment: Post-op Vital signs reviewed  Last Vitals:  Filed Vitals:   11/30/11 1645  BP: 121/69  Pulse: 91  Temp: 36.8 C  Resp: 15    Post vital signs: Reviewed  Level of consciousness: sedated  Complications: No apparent anesthesia complications

## 2011-11-30 NOTE — Op Note (Signed)
Preop Diagnosis: History of AIS and AGUS   Postop Diagnosis: History of AIS and AGUS   Procedure: HYSTERECTOMY TOTAL LAPAROSCOPIC CYSTOSCOPY   Anesthesia: General   Anesthesiologist: Dana Allan, MD   Attending: Purcell Nails, MD   Assistant: Jaymes Graff, MD  Findings: Nl appearing bilateral ovaries and tubes with several simple small paratubal cysts (approx 3), none bigger than a centimeter.  Pathology: Uterus and cervix 60.4g  Fluids: 1700 cc  UOP: 100 cc  EBL: 50 cc  Complications: None  Procedure: The patient was taken to the operating room, placed under general anesthesia and prepped and draped in the normal sterile fashion. A Foley catheter was placed. The uterus sounded to 7 1/2 cm. A weighted speculum and vaginal retractors were placed in the vagina. Tenaculum was placed on the anterior lip of the cervix.  A size 6cm tip was used and the rumi was placed, tip balloon and occluder insufflated. Attention was then turned to the abdomen. A 10 mm infraumbilical incision was made with the scalpel after 5 cc of 25% percent Marcaine was used for local anesthesia. The subcutaneous tissue was dissected and the fascia was incised with the knife. A purse string stitch was placed in the fascia and Hassan placed into the intra-abdominal cavity and anchored to the suture. Intraabdominal placement was confirmed with the laparoscope.  Two 5 mm trochars were placed in the right and left lower quadrants under direct visualization with the laparoscope.  The harmonic scalpel was used to cauterize and cut the uterine ovarian ligaments bilaterally.   Both round ligaments were cauterized and cut with the harmonic scalpel as well and the bladder flap created with the harmonic scalpel and removed away from the uterus. Both uterine arteries were cauterized and cut with harmonic scalpel.  The harmonic was used to circumscribe the [facility] ring and free the uterus and cervix. The uterus was then pulled  into the vagina. Both angles were sutured with 1 PDS.  The  remainder of the cuff was sutured with 1 PDS using interrupted stitches until the vaginal cuff was closed.  A total of 5 sutures were used.   Irrigation was performed.  Gas was allowed to leave the abdomen to check for bleeders and all pedicles were seen to be hemostatic the patient was given indigo carmine.  Cystoscopy was performed and both ureters were seen to efflux indigo carmine without difficulty. The bladder had full integrity with no suture or laceration visualized.  The vagina was inspected and the cuff was noted to be intact.  Attention was then turned back to the abdomen after removing top pair of gloves. The abdomen was reinsufflated with CO2 gas.  The abdomen and pelvis was copiously irrigated and  hemostasis was noted.  The 10 mm port on the patients left side was closed with 0 Vicryl using the fascial closure device.  All trochars were removed under direct visualization using the laparoscope.  The umbilical fascia was reapproximated using an interrupted stitch of 0 vicryl. The two 10 mm incisions were closed with 3-0 Monocryl via a subcuticular stitch.  All remaining skin incisions were closed with Dermabond and the 10 mm skin incisions were reinforced using Dermabond.  Sponge lap and needle counts were correct.  The patient tolerated the procedure well and was returned to the PACU in stable condition

## 2011-11-30 NOTE — Transfer of Care (Signed)
Immediate Anesthesia Transfer of Care Note  Patient: [name]  Procedure(s) Performed: Procedure(s) (LRB): HYSTERECTOMY TOTAL LAPAROSCOPIC (N/A)  Patient Location: PACU  Anesthesia Type: General  Level of Consciousness: awake and oriented  Airway & Oxygen Therapy: Patient Spontanous Breathing and Patient connected to nasal cannula oxygen  Post-op Assessment: Report given to PACU RN and Post -op Vital signs reviewed and stable  Post vital signs: Reviewed and stable  Complications: No apparent anesthesia complications

## 2011-12-01 ENCOUNTER — Encounter (HOSPITAL_COMMUNITY): Payer: Self-pay | Admitting: Obstetrics and Gynecology

## 2011-12-01 DIAGNOSIS — Z90710 Acquired absence of both cervix and uterus: Secondary | ICD-10-CM

## 2011-12-01 LAB — CBC
MCV: 90.9 fL (ref 78.0–100.0)
Platelets: 154 10*3/uL (ref 150–400)
RBC: 3.64 MIL/uL — ABNORMAL LOW (ref 3.87–5.11)
RDW: 13.2 % (ref 11.5–15.5)
WBC: 10.7 10*3/uL — ABNORMAL HIGH (ref 4.0–10.5)

## 2011-12-01 MED ORDER — OXYCODONE-ACETAMINOPHEN 5-325 MG PO TABS: 1.0000 | ORAL_TABLET | ORAL | Status: DC | PRN

## 2011-12-01 MED ORDER — IBUPROFEN 600 MG PO TABS: 600.0000 mg | ORAL_TABLET | Freq: Four times a day (QID) | ORAL | Status: AC | PRN

## 2011-12-01 MED ORDER — OXYCODONE-ACETAMINOPHEN 5-325 MG PO TABS: 1.0000 | ORAL_TABLET | Freq: Four times a day (QID) | ORAL | Status: AC | PRN

## 2011-12-01 MED ORDER — ONDANSETRON HCL 4 MG PO TABS: 4.0000 mg | ORAL_TABLET | Freq: Three times a day (TID) | ORAL | Status: AC | PRN

## 2011-12-01 NOTE — Addendum Note (Signed)
Addendum  created 12/01/11 1610 by Suella Grove, CRNA   Modules edited:Notes Section

## 2011-12-01 NOTE — Progress Notes (Signed)
[name] is a36 y.o.  [id number]  Post Op Date # 1  Subjective: Patient is Doing well postoperatively. Patient has Pain is controlled with current analgesics. Medications being used: PCA., Denies lightheadedness though has some last night with initial ambulation but it soon resolved. Tolerating liquids, hasn't passed flatus and Foley still in place.  Objective: Vital signs in last 24 hours: Temp:  [97.8 F (36.6 C)-98.4 F (36.9 C)] 98.2 F (36.8 C) (08/29 0556) Pulse Rate:  [60-91] 75  (08/29 0556) Resp:  [12-20] 18  (08/29 0556) BP: (96-123)/(53-78) 96/59 mmHg (08/29 0556) SpO2:  [98 %-100 %] 100 % (08/29 0556) Weight:  [125 lb (56.7 kg)] 125 lb (56.7 kg) (08/28 2013)  Intake/Output from previous day: 08/28 0701 - 08/29 0700 In: 3179 [I.V.:3175] Out: 1750 [Urine:1700] Intake/Output this shift:    Lab 12/01/11 0535 11/28/11 0855  WBC 10.7* 5.2  HGB 11.3* 13.2  HCT 33.1* 39.9  PLT 154 192    No results found for this basename: NA:3,K:3,CL:3,CO2:3,BUN:3,CREATININE:3,CALCIUM:3,LABALBU:3,PROT:3,BILITOT:3,ALKPHOS:3,ALT:3,AST:3,GLUCOSE:3 in the last 168 hours  EXAM: General: alert, cooperative and no distress Resp: clear to auscultation bilaterally Cardio: regular rate and rhythm, S1, S2 normal, no murmur, click, rub or gallop GI: bowel sound present, soft, incisions intact without evidence of infection Extremities: SCD hose in place and functiioning, no calf tenderness and negative Homan's sign Vaginal Bleeding: none and     Assessment: s/p Procedure(s): HYSTERECTOMY TOTAL LAPAROSCOPIC: stable and progressing well  Plan: Advance diet Encourage ambulation Advance to PO medication Discontinue IV fluids Discharge home  LOS: 1 day    Laporsche Hoeger, PA-C 12/01/2011 7:17 AM

## 2011-12-01 NOTE — Discharge Summary (Signed)
Physician Discharge Summary  Patient ID: GESENIA BANTZ MRN: [id number] DOB/AGE: 37-01-1976 37 y.o.  Admit date: 11/30/2011 Discharge date: 12/01/2011  Admission Diagnoses:AIS  Discharge Diagnoses:Same Active Problems:  * No active hospital problems. *    Discharged Condition: good  Hospital Course: s/p TLH and Cystoscopy with routine uncomplicated postop course  Consults: None  Significant Diagnostic Studies: n/a  Treatments: IV hydration and surgery: TLH and cystoscopy  Discharge Exam: Blood pressure 104/65, pulse 61, temperature 97.9 F (36.6 C), temperature source Oral, resp. rate 18, height 5\' 5"  (1.651 m), weight 125 lb (56.7 kg), SpO2 99.00%. General appearance: alert and no distress GI: soft, NABS, ND, incisions c/d/i Incision/Wound: c/d/i no vaginal bleeding  Disposition: 01-Home or Self Care  Discharge Orders    Future Appointments: Provider: Department: Dept Phone: Center:   12/23/2011 9:30 AM Purcell Nails, MD [facility] [phone] None   01/11/2012 4:00 PM Purcell Nails, MD [facility] [phone] None     Medication List  As of 12/01/2011  1:48 PM   TAKE these medications         ibuprofen 600 MG tablet   Commonly known as: ADVIL,MOTRIN   Take 1 tablet (600 mg total) by mouth every 6 (six) hours as needed for pain. use as directed pc x 5 days then prn      ibuprofen 600 MG tablet   Commonly known as: ADVIL,MOTRIN   Take 1 tablet (600 mg total) by mouth every 6 (six) hours as needed for pain.      ondansetron 4 MG tablet   Commonly known as: ZOFRAN   Take 1 tablet (4 mg total) by mouth every 8 (eight) hours as needed for nausea.      ONE-A-DAY WOMENS FORMULA PO   Take 1 tablet by mouth daily.      oxyCODONE-acetaminophen 5-325 MG per tablet   Commonly known as: PERCOCET/ROXICET   Take 1-2 tablets by mouth every 4 (four) hours as needed for pain (moderate to severe pain (when tolerating fluids)).      oxyCODONE-acetaminophen 5-325 MG  per tablet   Commonly known as: PERCOCET/ROXICET   Take 1-2 tablets by mouth every 6 (six) hours as needed for pain.           Follow-up Information    Follow up with Purcell Nails, MD on 01/11/2012. (Appointment time 4 p.m.)    Contact information:   [address] [place] [address] [phone]         D/C instructions discussed with pt and her husband. She is ambulating without difficulty and voiding spontaneously.   SignedPurcell Nails 12/01/2011, 1:48 PM

## 2011-12-01 NOTE — Discharge Summary (Signed)
  Physician Discharge Summary  Patient ID: [name] MRN: [id number] DOB/AGE: 09-Jul-1975 36 y.o.  Admit date: 11/30/2011 Discharge date: 12/01/2011   Discharge Diagnoses:  Adenocarcinoma In Situ and Atypical Glandular Cells of Undetermined Significance        Active Problems:  * No active hospital problems. *    Operation:  Total Laparoscopic Hysterectomy and Cystoscopy   Discharged Condition: Stable  Hospital Course: On the date of admission the patient underwent aforementioned procedures, tolerating them well.  Post operative course was unremarkable with patient resuming bowel and bladder function by post operative day #1 and was therefore deemed ready for discharge home.  Disposition: 01-Home or Self Care  Discharge Medications:   [name], Top  Home Medication Instructions [id number]   Printed on:12/01/11 0751  Medication Information                    Multiple Vitamins-Calcium (ONE-A-DAY WOMENS FORMULA PO) Take 1 tablet by mouth daily.             oxyCODONE-acetaminophen (PERCOCET/ROXICET) 5-325 MG per tablet Take 1-2 tablets by mouth every 4 (four) hours as needed for pain (moderate to severe pain (when tolerating fluids)).           ibuprofen (ADVIL,MOTRIN) 600 MG tablet Take 1 tablet (600 mg total) by mouth every 6 (six) hours as needed for pain. use as directed pc x 5 days then prn           ondansetron (ZOFRAN) 4 MG tablet Take 1 tablet (4 mg total) by mouth every 8 (eight) hours as needed for nausea.               Follow-up:  Dr. Su Hilt, January 11, 2012 at 4 p.m.   SignedHenreitta Leber, PA-C 12/01/2011, 7:51 AM

## 2011-12-01 NOTE — Anesthesia Postprocedure Evaluation (Signed)
  Anesthesia Post-op Note  Patient: [name]  Procedure(s) Performed: Procedure(s) (LRB): HYSTERECTOMY TOTAL LAPAROSCOPIC (N/A)  [facility]  Anesthesia Type: General  Level of Consciousness: awake and alert   Airway and Oxygen Therapy: Patient Spontanous Breathing  Post-op Pain: none  Post-op Assessment: Patient's Cardiovascular Status Stable, Respiratory Function Stable, Patent Airway, No signs of Nausea or vomiting, Adequate PO intake and Pain level controlled  Post-op Vital Signs: Reviewed and stable  Complications: No apparent anesthesia complications

## 2011-12-12 ENCOUNTER — Telehealth: Payer: Self-pay | Admitting: Obstetrics and Gynecology

## 2011-12-12 NOTE — Telephone Encounter (Signed)
Spoke to pt who states she is having some sx's after Lap. Hyst about two weeks ago. She feels a little light-headed and weak, somewhat crampy and having what she thinks may be hot flashes followed by feeling cold. She is not having any bleeding. She has no fever. No redness around her incision site. Ib helps. She feels like she may have over done it over the weekend at the mall where she did quite a bit of walking and was pushing her child's stroller. I recommended that she alternate with tylenol and Ib for the next 24 hours, eat healthy, push fluids, get plenty of rest and report back tomorrow around same time and see if her sx's have improved. Pt is agreeable. Melody Comas A

## 2011-12-12 NOTE — Telephone Encounter (Signed)
JACKIE/POST. SURG. ISSUES

## 2011-12-13 ENCOUNTER — Telehealth: Payer: Self-pay

## 2011-12-13 NOTE — Telephone Encounter (Signed)
Tc to pt per follow up of telephone call from 12/12/11. Pt states,"feeling better today". Pt increased water intake with improvement of lightheadedness. No fever. No vaginal bleeding. Pt states,"if pain does occur Ibuprofen resolves it". Will make Jackie(CMN for Dr. Su Hilt aware). Told pt to cb if needed. Pt agrees.

## 2011-12-23 ENCOUNTER — Ambulatory Visit (INDEPENDENT_AMBULATORY_CARE_PROVIDER_SITE_OTHER): Payer: PRIVATE HEALTH INSURANCE | Admitting: Obstetrics and Gynecology

## 2011-12-23 ENCOUNTER — Encounter: Payer: Self-pay | Admitting: Obstetrics and Gynecology

## 2011-12-23 VITALS — BP 110/70 | Ht 65.0 in | Wt 128.0 lb

## 2011-12-23 DIAGNOSIS — Z90710 Acquired absence of both cervix and uterus: Secondary | ICD-10-CM

## 2011-12-23 NOTE — Progress Notes (Addendum)
DATE OF SURGERY: 11/29/21 TYPE OF SURGERY:Laparoscopic Hysterectomy PAIN:No VAG BLEEDING: no VAG DISCHARGE: no NORMAL GI FUNCTN: yes NORMAL GU FUNCTN: yes  Uterus and cervix - ENDOMETRIUM: BENIGN PROLIFERATIVE ENDOMETRIUM, NO ATYPIA, HYPERPLASIA OR MALIGNANCY. - CERVIX: BENIGN SQUAMOUS MUCOSA AND ENDOCERVICAL MUCOSA WITH PREVIOUS BIOPSY SITE CHANGES, NO DYSPLASIA OR MALIGNANCY.  C/o occas light headedness.    Filed Vitals:   12/23/11 1008  BP: 110/70   ROS: noncontributory  Pelvic exam:  VULVA: normal appearing vulva with no masses, tenderness or lesions,  VAGINA: normal appearing vagina with normal color and discharge, no lesions, ADNEXA: normal adnexa in size, nontender and no masses. Well healed incisions  A/P Cont no IC or lifting greater than 15lbs the next 3wks Then may resume nl activities gradually RTO in for repeat pap And AEX in 64yr Inc H2O Check CBC at pts convenience

## 2011-12-27 ENCOUNTER — Telehealth: Payer: Self-pay

## 2011-12-27 ENCOUNTER — Other Ambulatory Visit: Payer: Self-pay

## 2011-12-27 DIAGNOSIS — D649 Anemia, unspecified: Secondary | ICD-10-CM

## 2011-12-27 NOTE — Telephone Encounter (Signed)
LM for pt to call back. She needs repeat cbc and got out of the office before we could get her to do it when she was here for last OV. Melody Comas A

## 2011-12-27 NOTE — Telephone Encounter (Signed)
Spoke to pt to let her know she needs repeat cbc. She will come in for it tomorrow. Melody Comas A

## 2011-12-28 ENCOUNTER — Other Ambulatory Visit: Payer: PRIVATE HEALTH INSURANCE

## 2011-12-28 DIAGNOSIS — D649 Anemia, unspecified: Secondary | ICD-10-CM

## 2011-12-29 LAB — CBC
HCT: 38.2 % (ref 36.0–46.0)
Hemoglobin: 12.5 g/dL (ref 12.0–15.0)
MCH: 29.8 pg (ref 26.0–34.0)
MCHC: 32.7 g/dL (ref 30.0–36.0)
MCV: 91 fL (ref 78.0–100.0)
Platelets: 228 10*3/uL (ref 150–400)
RBC: 4.2 MIL/uL (ref 3.87–5.11)
RDW: 14.4 % (ref 11.5–15.5)
WBC: 7.5 10*3/uL (ref 4.0–10.5)

## 2012-01-11 ENCOUNTER — Encounter: Payer: PRIVATE HEALTH INSURANCE | Admitting: Obstetrics and Gynecology

## 2012-04-27 ENCOUNTER — Encounter: Payer: PRIVATE HEALTH INSURANCE | Admitting: Obstetrics and Gynecology

## 2012-05-25 ENCOUNTER — Encounter: Payer: Self-pay | Admitting: Obstetrics and Gynecology

## 2012-05-25 ENCOUNTER — Ambulatory Visit: Payer: Commercial Indemnity | Admitting: Obstetrics and Gynecology

## 2012-05-25 VITALS — BP 120/78 | Resp 16 | Ht 65.0 in | Wt 129.0 lb

## 2012-05-25 DIAGNOSIS — R350 Frequency of micturition: Secondary | ICD-10-CM

## 2012-05-25 DIAGNOSIS — D099 Carcinoma in situ, unspecified: Secondary | ICD-10-CM

## 2012-05-25 DIAGNOSIS — R5383 Other fatigue: Secondary | ICD-10-CM

## 2012-05-25 LAB — POCT URINALYSIS DIPSTICK
Bilirubin, UA: NEGATIVE
Glucose, UA: NEGATIVE
Ketones, UA: NEGATIVE
Nitrite, UA: NEGATIVE
pH, UA: 7

## 2012-05-25 LAB — CBC
HCT: 39.2 % (ref 36.0–46.0)
Hemoglobin: 13.2 g/dL (ref 12.0–15.0)
MCHC: 33.7 g/dL (ref 30.0–36.0)
MCV: 91.2 fL (ref 78.0–100.0)

## 2012-05-25 NOTE — Progress Notes (Signed)
Here c/o pain in lower abd after holding urine overnight.  No leaking.  Voids 6-7x/day.  Only rarely leaks with cough.  Filed Vitals:   05/25/12 1537  BP: 120/78  Resp: 16  ROS: noncontributory  Pelvic exam:  VULVA: normal appearing vulva with no masses, tenderness or lesions,  VAGINA: normal appearing vagina with normal color and discharge, no lesions, ADNEXA: normal adnexa in size, nontender and no masses.  A/P UCx Pap today - repeat in If bladder sxs persist, pt may opt for bladder testing

## 2012-05-26 LAB — VITAMIN D 25 HYDROXY (VIT D DEFICIENCY, FRACTURES): Vit D, 25-Hydroxy: 37 ng/mL (ref 30–89)

## 2012-05-27 LAB — URINE CULTURE

## 2012-05-29 LAB — PAP IG W/ RFLX HPV ASCU

## 2012-06-27 ENCOUNTER — Other Ambulatory Visit: Payer: Self-pay | Admitting: Obstetrics and Gynecology

## 2013-09-11 ENCOUNTER — Telehealth: Payer: Self-pay | Admitting: Family

## 2013-09-11 NOTE — Telephone Encounter (Signed)
appt given for the 17th with [facility]

## 2013-09-18 ENCOUNTER — Ambulatory Visit (INDEPENDENT_AMBULATORY_CARE_PROVIDER_SITE_OTHER): Payer: BC Managed Care – PPO | Admitting: Family

## 2013-09-18 ENCOUNTER — Encounter: Payer: Self-pay | Admitting: Family

## 2013-09-18 VITALS — BP 113/75 | HR 66 | Temp 98.3°F | Ht 66.0 in | Wt 122.0 lb

## 2013-09-18 DIAGNOSIS — R5383 Other fatigue: Secondary | ICD-10-CM

## 2013-09-18 DIAGNOSIS — Z0000 Encounter for general adult medical examination without abnormal findings: Secondary | ICD-10-CM

## 2013-09-18 DIAGNOSIS — R5381 Other malaise: Secondary | ICD-10-CM

## 2013-09-18 DIAGNOSIS — R42 Dizziness and giddiness: Secondary | ICD-10-CM

## 2013-09-18 LAB — POCT GLYCOSYLATED HEMOGLOBIN (HGB A1C)

## 2013-09-18 NOTE — Patient Instructions (Addendum)
Health Maintenance, Female A healthy lifestyle and preventative care can promote health and wellness.  Maintain regular health, dental, and eye exams.  Eat a healthy diet. Foods like vegetables, fruits, whole grains, low-fat dairy products, and lean protein foods contain the nutrients you need without too many calories. Decrease your intake of foods high in solid fats, added sugars, and salt. Get information about a proper diet from your caregiver, if necessary.  Regular physical exercise is one of the most important things you can do for your health. Most adults should get at least 150 minutes of moderate-intensity exercise (any activity that increases your heart rate and causes you to sweat) each week. In addition, most adults need muscle-strengthening exercises on 2 or more days a week.   Maintain a healthy weight. The body mass index (BMI) is a screening tool to identify possible weight problems. It provides an estimate of body fat based on height and weight. Your caregiver can help determine your BMI, and can help you achieve or maintain a healthy weight. For adults 20 years and older:  A BMI below 18.5 is considered underweight.  A BMI of 18.5 to 24.9 is normal.  A BMI of 25 to 29.9 is considered overweight.  A BMI of 30 and above is considered obese.  Maintain normal blood lipids and cholesterol by exercising and minimizing your intake of saturated fat. Eat a balanced diet with plenty of fruits and vegetables. Blood tests for lipids and cholesterol should begin at age 20 and be repeated every 5 years. If your lipid or cholesterol levels are high, you are over 50, or you are a high risk for heart disease, you may need your cholesterol levels checked more frequently.Ongoing high lipid and cholesterol levels should be treated with medicines if diet and exercise are not effective.  If you smoke, find out from your caregiver how to quit. If you do not use tobacco, do not start.  Lung  cancer screening is recommended for adults aged 55 80 years who are at high risk for developing lung cancer because of a history of smoking. Yearly low-dose computed tomography (CT) is recommended for people who have at least a 30-pack-year history of smoking and are a current smoker or have quit within the past 15 years. A pack year of smoking is smoking an average of 1 pack of cigarettes a day for 1 year (for example: 1 pack a day for 30 years or 2 packs a day for 15 years). Yearly screening should continue until the smoker has stopped smoking for at least 15 years. Yearly screening should also be stopped for people who develop a health problem that would prevent them from having lung cancer treatment.  If you are pregnant, do not drink alcohol. If you are breastfeeding, be very cautious about drinking alcohol. If you are not pregnant and choose to drink alcohol, do not exceed 1 drink per day. One drink is considered to be 12 ounces (355 mL) of beer, 5 ounces (148 mL) of wine, or 1.5 ounces (44 mL) of liquor.  Avoid use of street drugs. Do not share needles with anyone. Ask for help if you need support or instructions about stopping the use of drugs.  High blood pressure causes heart disease and increases the risk of stroke. Blood pressure should be checked at least every 1 to 2 years. Ongoing high blood pressure should be treated with medicines, if weight loss and exercise are not effective.  If you are 55 to   39 years old, ask your caregiver if you should take aspirin to prevent strokes.  Diabetes screening involves taking a blood sample to check your fasting blood sugar level. This should be done once every 3 years, after age 55, if you are within normal weight and without risk factors for diabetes. Testing should be considered at a younger age or be carried out more frequently if you are overweight and have at least 1 risk factor for diabetes.  Breast cancer screening is essential preventative care  for women. You should practice "breast self-awareness." This means understanding the normal appearance and feel of your breasts and may include breast self-examination. Any changes detected, no matter how small, should be reported to a caregiver. Women in their 48s and 30s should have a clinical breast exam (CBE) by a caregiver as part of a regular health exam every 1 to 3 years. After age 31, women should have a CBE every year. Starting at age 40, women should consider having a mammogram (breast X-ray) every year. Women who have a family history of breast cancer should talk to their caregiver about genetic screening. Women at a high risk of breast cancer should talk to their caregiver about having an MRI and a mammogram every year.  Breast cancer gene (BRCA)-related cancer risk assessment is recommended for women who have family members with BRCA-related cancers. BRCA-related cancers include breast, ovarian, tubal, and peritoneal cancers. Having family members with these cancers may be associated with an increased risk for harmful changes (mutations) in the breast cancer genes BRCA1 and BRCA2. Results of the assessment will determine the need for genetic counseling and BRCA1 and BRCA2 testing.  The Pap test is a screening test for cervical cancer. Women should have a Pap test starting at age 23. Between ages 9 and 48, Pap tests should be repeated every 2 years. Beginning at age 90, you should have a Pap test every 3 years as long as the past 3 Pap tests have been normal. If you had a hysterectomy for a problem that was not cancer or a condition that could lead to cancer, then you no longer need Pap tests. If you are between ages 92 and 68, and you have had normal Pap tests going back 10 years, you no longer need Pap tests. If you have had past treatment for cervical cancer or a condition that could lead to cancer, you need Pap tests and screening for cancer for at least 20 years after your treatment. If Pap  tests have been discontinued, risk factors (such as a new sexual partner) need to be reassessed to determine if screening should be resumed. Some women have medical problems that increase the chance of getting cervical cancer. In these cases, your caregiver may recommend more frequent screening and Pap tests.  The human papillomavirus (HPV) test is an additional test that may be used for cervical cancer screening. The HPV test looks for the virus that can cause the cell changes on the cervix. The cells collected during the Pap test can be tested for HPV. The HPV test could be used to screen women aged 25 years and older, and should be used in women of any age who have unclear Pap test results. After the age of 46, women should have HPV testing at the same frequency as a Pap test.  Colorectal cancer can be detected and often prevented. Most routine colorectal cancer screening begins at the age of 58 and continues through age 41. However, your caregiver  may recommend screening at an earlier age if you have risk factors for colon cancer. On a yearly basis, your caregiver may provide home test kits to check for hidden blood in the stool. Use of a small camera at the end of a tube, to directly examine the colon (sigmoidoscopy or colonoscopy), can detect the earliest forms of colorectal cancer. Talk to your caregiver about this at age 25, when routine screening begins. Direct examination of the colon should be repeated every 5 to 10 years through age 14, unless early forms of pre-cancerous polyps or small growths are found.  Hepatitis C blood testing is recommended for all people born from [birth year] through 1966 and any individual with known risks for hepatitis C.  Practice safe sex. Use condoms and avoid high-risk sexual practices to reduce the spread of sexually transmitted infections (STIs). Sexually active women aged 49 and younger should be checked for Chlamydia, which is a common sexually transmitted infection.  Older women with new or multiple partners should also be tested for Chlamydia. Testing for other STIs is recommended if you are sexually active and at increased risk.  Osteoporosis is a disease in which the bones lose minerals and strength with aging. This can result in serious bone fractures. The risk of osteoporosis can be identified using a bone density scan. Women ages 34 and over and women at risk for fractures or osteoporosis should discuss screening with their caregivers. Ask your caregiver whether you should be taking a calcium supplement or vitamin D to reduce the rate of osteoporosis.  Menopause can be associated with physical symptoms and risks. Hormone replacement therapy is available to decrease symptoms and risks. You should talk to your caregiver about whether hormone replacement therapy is right for you.  Use sunscreen. Apply sunscreen liberally and repeatedly throughout the day. You should seek shade when your shadow is shorter than you. Protect yourself by wearing long sleeves, pants, a wide-brimmed hat, and sunglasses year round, whenever you are outdoors.  Notify your caregiver of new moles or changes in moles, especially if there is a change in shape or color. Also notify your caregiver if a mole is larger than the size of a pencil eraser.  Stay current with your immunizations. Document Released: 10/04/2010 Document Revised: 07/16/2012 Document Reviewed: 10/04/2010 [facility] Patient Information 2014 [place]. Fatigue Fatigue is a feeling of tiredness, lack of energy, lack of motivation, or feeling tired all the time. Having enough rest, good nutrition, and reducing stress will normally reduce fatigue. Consult your caregiver if it persists. The nature of your fatigue will help your caregiver to find out its cause. The treatment is based on the cause.  CAUSES  There are many causes for fatigue. Most of the time, fatigue can be traced to one or more of your habits or routines.  Most causes fit into one or more of three general areas. They are: Lifestyle problems  Sleep disturbances.  Overwork.  Physical exertion.  Unhealthy habits.  Poor eating habits or eating disorders.  Alcohol and/or drug use .  Lack of proper nutrition (malnutrition). Psychological problems  Stress and/or anxiety problems.  Depression.  Grief.  Boredom. Medical Problems or Conditions  Anemia.  Pregnancy.  Thyroid gland problems.  Recovery from major surgery.  Continuous pain.  Emphysema or asthma that is not well controlled  Allergic conditions.  Diabetes.  Infections (such as mononucleosis).  Obesity.  Sleep disorders, such as sleep apnea.  Heart failure or other heart-related problems.  Cancer.  Kidney disease.  Liver disease.  Effects of certain medicines such as antihistamines, cough and cold remedies, prescription pain medicines, heart and blood pressure medicines, drugs used for treatment of cancer, and some antidepressants. SYMPTOMS  The symptoms of fatigue include:   Lack of energy.  Lack of drive (motivation).  Drowsiness.  Feeling of indifference to the surroundings. DIAGNOSIS  The details of how you feel help guide your caregiver in finding out what is causing the fatigue. You will be asked about your present and past health condition. It is important to review all medicines that you take, including prescription and non-prescription items. A thorough exam will be done. You will be questioned about your feelings, habits, and normal lifestyle. Your caregiver may suggest blood tests, urine tests, or other tests to look for common medical causes of fatigue.  TREATMENT  Fatigue is treated by correcting the underlying cause. For example, if you have continuous pain or depression, treating these causes will improve how you feel. Similarly, adjusting the dose of certain medicines will help in reducing fatigue.  HOME CARE INSTRUCTIONS   Try to  get the required amount of good sleep every night.  Eat a healthy and nutritious diet, and drink enough water throughout the day.  Practice ways of relaxing (including yoga or meditation).  Exercise regularly.  Make plans to change situations that cause stress. Act on those plans so that stresses decrease over time. Keep your work and personal routine reasonable.  Avoid street drugs and minimize use of alcohol.  Start taking a daily multivitamin after consulting your caregiver. SEEK MEDICAL CARE IF:   You have persistent tiredness, which cannot be accounted for.  You have fever.  You have unintentional weight loss.  You have headaches.  You have disturbed sleep throughout the night.  You are feeling sad.  You have constipation.  You have dry skin.  You have gained weight.  You are taking any new or different medicines that you suspect are causing fatigue.  You are unable to sleep at night.  You develop any unusual swelling of your legs or other parts of your body. SEEK IMMEDIATE MEDICAL CARE IF:   You are feeling confused.  Your vision is blurred.  You feel faint or pass out.  You develop severe headache.  You develop severe abdominal, pelvic, or back pain.  You develop chest pain, shortness of breath, or an irregular or fast heartbeat.  You are unable to pass a normal amount of urine.  You develop abnormal bleeding such as bleeding from the rectum or you vomit blood.  You have thoughts about harming yourself or committing suicide.  You are worried that you might harm someone else. MAKE SURE YOU:   Understand these instructions.  Will watch your condition.  Will get help right away if you are not doing well or get worse. Document Released: 01/16/2007 Document Revised: 06/13/2011 Document Reviewed: 01/16/2007 [facility] Patient Information 2015 [place], [place]. This information is not intended to replace advice given to you by your health care Maresha Anastos.  Make sure you discuss any questions you have with your health care Emilliano Dilworth.

## 2013-09-18 NOTE — Progress Notes (Signed)
   Subjective:    Patient ID: [name], female    DOB: [birth year], 38 y.o.   MRN: [id number]  HPI Pt presents to office to establish care. Pt complaining of fatigue, weakness, and dizziness. PT states she has had this intermittently been going on for years. However, it seems to become to more frequently now and accompanied by nausea. Pt states that when she has these epsiodes of weakness, dizziness, and shakiness eating something usually makes her feel better. Pt denies any pain, SOB, or edema.    Review of Systems  Constitutional: Positive for fatigue.  HENT: Negative.   Eyes: Negative.   Respiratory: Negative.   Cardiovascular: Positive for palpitations.  Gastrointestinal: Negative.   Genitourinary: Negative.   Musculoskeletal: Negative.   Neurological: Positive for dizziness and weakness. Negative for headaches.  Psychiatric/Behavioral: The patient is nervous/anxious.   All other systems reviewed and are negative.      Objective:   Physical Exam  Vitals reviewed. Constitutional: She is oriented to person, place, and time. She appears well-developed and well-nourished. No distress.  HENT:  Head: Normocephalic and atraumatic.  Right Ear: External ear normal.  Left Ear: External ear normal.  Mouth/Throat: Oropharynx is clear and moist.  Eyes: Pupils are equal, round, and reactive to light.  Neck: Normal range of motion. Neck supple. No thyromegaly present.  Cardiovascular: Normal rate, regular rhythm, normal heart sounds and intact distal pulses.   No murmur heard. Pulmonary/Chest: Effort normal and breath sounds normal. No respiratory distress. She has no wheezes.  Abdominal: Soft. Bowel sounds are normal. She exhibits no distension. There is no tenderness.  Musculoskeletal: Normal range of motion. She exhibits no edema and no tenderness.  Neurological: She is alert and oriented to person, place, and time. She has normal reflexes. No cranial nerve deficit.  Skin: Skin  is warm and dry.  Psychiatric: She has a normal mood and affect. Her behavior is normal. Judgment and thought content normal.    BP 113/75  Pulse 66  Temp(Src) 98.3 F (36.8 C) (Oral)  Ht $R'5\' 6"'sb$  (1.676 m)  Wt 122 lb (55.339 kg)  BMI 19.70 kg/m2  LMP 11/27/2011       Assessment & Plan:  1. Fatigue - Vit D  25 hydroxy (rtn osteoporosis monitoring) - Thyroid Panel With TSH  2. Dizziness -Falls precaution discussed - POCT glycosylated hemoglobin (Hb A1C)  3. Annual physical exam - CMP14+EGFR - Lipid panel   Continue all meds Labs pending Health Maintenance reviewed Diet and exercise encouraged RTO 1 year  Evelina Dun, FNP

## 2013-09-19 LAB — CMP14+EGFR
A/G RATIO: 2.1 (ref 1.1–2.5)
ALK PHOS: 57 IU/L (ref 39–117)
ALT: 10 IU/L (ref 0–32)
AST: 16 IU/L (ref 0–40)
Albumin: 4.7 g/dL (ref 3.5–5.5)
BUN / CREAT RATIO: 14 (ref 8–20)
BUN: 11 mg/dL (ref 6–20)
CALCIUM: 9.9 mg/dL (ref 8.7–10.2)
CO2: 21 mmol/L (ref 18–29)
Chloride: 100 mmol/L (ref 97–108)
Creatinine, Ser: 0.78 mg/dL (ref 0.57–1.00)
GFR calc Af Amer: 111 mL/min/{1.73_m2} (ref >59)
GFR, EST NON AFRICAN AMERICAN: 96 mL/min/{1.73_m2} (ref >59)
Globulin, Total: 2.2 g/dL (ref 1.5–4.5)
Glucose: 81 mg/dL (ref 65–99)
POTASSIUM: 4.3 mmol/L (ref 3.5–5.2)
SODIUM: 140 mmol/L (ref 134–144)
Total Bilirubin: 0.6 mg/dL (ref 0.0–1.2)
Total Protein: 6.9 g/dL (ref 6.0–8.5)

## 2013-09-19 LAB — THYROID PANEL WITH TSH
FREE THYROXINE INDEX: 2 (ref 1.2–4.9)
T3 UPTAKE RATIO: 26 % (ref 24–39)
T4 TOTAL: 7.6 ug/dL (ref 4.5–12.0)
TSH: 3.61 u[IU]/mL (ref 0.450–4.500)

## 2013-09-19 LAB — LIPID PANEL
CHOL/HDL RATIO: 3.7 ratio (ref 0.0–4.4)
Cholesterol, Total: 200 mg/dL — ABNORMAL HIGH (ref 100–199)
HDL: 54 mg/dL (ref >39)
LDL Calculated: 129 mg/dL — ABNORMAL HIGH (ref 0–99)
Triglycerides: 86 mg/dL (ref 0–149)
VLDL Cholesterol Cal: 17 mg/dL (ref 5–40)

## 2013-09-19 LAB — VITAMIN D 25 HYDROXY (VIT D DEFICIENCY, FRACTURES): VIT D 25 HYDROXY: 50.9 ng/mL (ref 30.0–100.0)

## 2013-12-04 ENCOUNTER — Ambulatory Visit (INDEPENDENT_AMBULATORY_CARE_PROVIDER_SITE_OTHER): Payer: BC Managed Care – PPO | Admitting: Family Medicine

## 2013-12-04 ENCOUNTER — Encounter: Payer: Self-pay | Admitting: Family Medicine

## 2013-12-04 VITALS — BP 120/72 | HR 76 | Temp 98.9°F | Ht 66.0 in | Wt 124.0 lb

## 2013-12-04 DIAGNOSIS — Z0000 Encounter for general adult medical examination without abnormal findings: Secondary | ICD-10-CM

## 2013-12-04 DIAGNOSIS — Z0289 Encounter for other administrative examinations: Secondary | ICD-10-CM

## 2013-12-04 DIAGNOSIS — Z1211 Encounter for screening for malignant neoplasm of colon: Secondary | ICD-10-CM | POA: Insufficient documentation

## 2013-12-04 DIAGNOSIS — Z111 Encounter for screening for respiratory tuberculosis: Secondary | ICD-10-CM

## 2013-12-04 NOTE — Patient Instructions (Signed)

## 2013-12-04 NOTE — Progress Notes (Signed)
   Subjective:    Patient ID: [name], female    DOB: 04-13-1975, 38 y.o.   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 She is status post hysterectomy for adenocarcinoma in situ. She describes some occasional bladder symptoms since then, some mild incontinence and spasm. There is no history of exposure to tuberculosis, back problems, or cardiorespiratory symptoms.    Review of Systems  Constitutional: Negative.   HENT: Negative.   Eyes: Negative.   Respiratory: Negative.   Cardiovascular: Negative.   Gastrointestinal: Negative.   Endocrine: Negative.   Genitourinary: Negative.   Musculoskeletal: Positive for myalgias.       Bilateral thumb pain  Skin: Rash: not true rash but healing intertrigo.  Hematological: Negative.   Psychiatric/Behavioral: Negative.        Objective:   Physical Exam  Constitutional: She is oriented to person, place, and time. She appears well-developed and well-nourished.  Eyes: Conjunctivae and EOM are normal.  Neck: Normal range of motion. Neck supple.  Cardiovascular: Normal rate, regular rhythm and normal heart sounds.   Pulmonary/Chest: Effort normal and breath sounds normal.  Abdominal: Soft. Bowel sounds are normal.  Musculoskeletal: Normal range of motion.  Neurological: She is alert and oriented to person, place, and time. She has normal reflexes.  Skin: Skin is warm and dry.  Psychiatric: She has a normal mood and affect. Her behavior is normal. Thought content normal.   BP 120/72  Pulse 76  Temp(Src) 98.9 F (37.2 C) (Oral)  Ht 5\' 6"  (1.676 m)  Wt 124 lb (56.246 kg)  BMI 20.02 kg/m2  LMP 11/27/2011       Assessment & Plan:  1. Encounter for TB tine test  - PPD  2. Routine general medical examination at a health   [facility] facility Exam unremarkable

## 2013-12-06 ENCOUNTER — Encounter: Payer: Self-pay | Admitting: *Deleted

## 2013-12-06 LAB — TB SKIN TEST
Induration: 0 mm
TB Skin Test: NEGATIVE

## 2014-02-03 ENCOUNTER — Encounter: Payer: Self-pay | Admitting: Family Medicine

## 2014-02-25 ENCOUNTER — Telehealth: Payer: Self-pay | Admitting: Family Medicine

## 2014-02-25 NOTE — Telephone Encounter (Signed)
Appointment given for 12/3 with [place].

## 2014-03-06 ENCOUNTER — Ambulatory Visit (INDEPENDENT_AMBULATORY_CARE_PROVIDER_SITE_OTHER): Payer: BC Managed Care – PPO | Admitting: Family Medicine

## 2014-03-06 ENCOUNTER — Encounter: Payer: Self-pay | Admitting: Family Medicine

## 2014-03-06 VITALS — BP 108/69 | HR 91 | Temp 98.3°F | Ht 66.0 in | Wt 124.6 lb

## 2014-03-06 DIAGNOSIS — K589 Irritable bowel syndrome without diarrhea: Secondary | ICD-10-CM

## 2014-03-06 MED ORDER — HYOSCYAMINE SULFATE ER 0.375 MG PO TB12: 0.3750 mg | ORAL_TABLET | Freq: Two times a day (BID) | ORAL | Status: DC

## 2014-03-06 MED ORDER — SERTRALINE HCL 50 MG PO TABS: 50.0000 mg | ORAL_TABLET | Freq: Every day | ORAL | Status: DC

## 2014-03-06 NOTE — Addendum Note (Signed)
Addended by: Selmer Dominion on: 03/06/2014 11:28 AM   Modules accepted: Orders

## 2014-03-06 NOTE — Addendum Note (Signed)
Addended by: Selmer Dominion on: 03/06/2014 11:31 AM   Modules accepted: Orders

## 2014-03-06 NOTE — Addendum Note (Signed)
Addended by: Selmer Dominion on: 03/06/2014 11:30 AM   Modules accepted: Orders

## 2014-03-06 NOTE — Progress Notes (Signed)
   Subjective:    Patient ID: [name], female    DOB: 29-Dec-1975, 38 y.o.   MRN: [id number]  HPI C/o abdominal pain for a month.  She has been having some bloating, gas, and bowel spasms.  She has been avoiding dairy products.  She has taken otc probiotics.  She still has a gallbladder.  She is c/o anxiety.  She is teary in the interview and states she has been feeling this way since her parents died a year ago.  Review of Systems  Constitutional: Negative for fever.  HENT: Negative for ear pain.   Eyes: Negative for discharge.  Respiratory: Negative for cough.   Cardiovascular: Negative for chest pain.  Gastrointestinal: Negative for abdominal distention.  Endocrine: Negative for polyuria.  Genitourinary: Negative for difficulty urinating.  Musculoskeletal: Negative for gait problem and neck pain.  Skin: Negative for color change and rash.  Neurological: Negative for speech difficulty and headaches.  Psychiatric/Behavioral: Negative for agitation.       Objective:    BP 108/69 mmHg  Pulse 91  Temp(Src) 98.3 F (36.8 C) (Oral)  Ht 5\' 6"  (1.676 m)  Wt 124 lb 9.6 oz (56.518 kg)  BMI 20.12 kg/m2  LMP 11/27/2011 Physical Exam  Constitutional: She is oriented to person, place, and time. She appears well-developed and well-nourished.  HENT:  Head: Normocephalic and atraumatic.  Mouth/Throat: Oropharynx is clear and moist.  Eyes: Pupils are equal, round, and reactive to light.  Neck: Normal range of motion. Neck supple.  Cardiovascular: Normal rate and regular rhythm.   No murmur heard. Pulmonary/Chest: Effort normal and breath sounds normal.  Abdominal: Soft. Bowel sounds are normal. There is no tenderness.  Neurological: She is alert and oriented to person, place, and time.  Skin: Skin is warm and dry.  Psychiatric: She has a normal mood and affect.          Assessment & Plan:     ICD-9-CM ICD-10-CM   1. Irritable bowel syndrome (IBS) 564.1 K58.9 hyoscyamine  (LEVBID) 0.375 MG 12 hr tablet     Gliadin IgA+tTG IgA     Lactose tolerance test   Depression - Sertraline 50 mg one po qd #30w/11 rf  No Follow-up on file.  Lysbeth Penner FNP

## 2014-03-10 LAB — ALLERGEN PROFILE, FOOD-MILK
F076-IgE Alpha Lactalbumin: <0.1 kU/L
F077-IgE Beta Lactoglobulin: <0.1 kU/L
F078-IgE Casein: <0.1 kU/L
F081-IgE Cheese, Cheddar Type: <0.1 kU/L
F082-IgE Cheese, Mold Type: <0.1 kU/L
Milk IgE: <0.1 kU/L

## 2014-03-10 LAB — GLIADIN IGA+TTG IGA
Antigliadin Abs, IgA: 2 units (ref 0–19)
Transglutaminase IgA: <2 U/mL (ref 0–3)

## 2014-06-12 ENCOUNTER — Ambulatory Visit (INDEPENDENT_AMBULATORY_CARE_PROVIDER_SITE_OTHER): Payer: BLUE CROSS/BLUE SHIELD | Admitting: Family

## 2014-06-12 ENCOUNTER — Ambulatory Visit (INDEPENDENT_AMBULATORY_CARE_PROVIDER_SITE_OTHER): Payer: BLUE CROSS/BLUE SHIELD

## 2014-06-12 ENCOUNTER — Encounter: Payer: Self-pay | Admitting: Family

## 2014-06-12 VITALS — BP 117/77 | HR 82 | Temp 97.6°F | Ht 66.0 in | Wt 124.8 lb

## 2014-06-12 DIAGNOSIS — K5900 Constipation, unspecified: Secondary | ICD-10-CM | POA: Diagnosis not present

## 2014-06-12 DIAGNOSIS — R1084 Generalized abdominal pain: Secondary | ICD-10-CM | POA: Diagnosis not present

## 2014-06-12 DIAGNOSIS — K219 Gastro-esophageal reflux disease without esophagitis: Secondary | ICD-10-CM | POA: Diagnosis not present

## 2014-06-12 MED ORDER — PANTOPRAZOLE SODIUM 40 MG PO TBEC: 40.0000 mg | DELAYED_RELEASE_TABLET | Freq: Every day | ORAL | Status: DC

## 2014-06-12 NOTE — Patient Instructions (Signed)
Gastroesophageal Reflux Disease, Adult  Gastroesophageal reflux disease (GERD) happens when acid from your stomach flows up into the esophagus. When acid comes in contact with the esophagus, the acid causes soreness (inflammation) in the esophagus. Over time, GERD may create small holes (ulcers) in the lining of the esophagus.  CAUSES   · Increased body weight. This puts pressure on the stomach, making acid rise from the stomach into the esophagus.  · Smoking. This increases acid production in the stomach.  · Drinking alcohol. This causes decreased pressure in the lower esophageal sphincter (valve or ring of muscle between the esophagus and stomach), allowing acid from the stomach into the esophagus.  · Late evening meals and a full stomach. This increases pressure and acid production in the stomach.  · A malformed lower esophageal sphincter.  Sometimes, no cause is found.  SYMPTOMS   · Burning pain in the lower part of the mid-chest behind the breastbone and in the mid-stomach area. This may occur twice a week or more often.  · Trouble swallowing.  · Sore throat.  · Dry cough.  · Asthma-like symptoms including chest tightness, shortness of breath, or wheezing.  DIAGNOSIS   Your caregiver may be able to diagnose GERD based on your symptoms. In some cases, X-rays and other tests may be done to check for complications or to check the condition of your stomach and esophagus.  TREATMENT   Your caregiver may recommend over-the-counter or prescription medicines to help decrease acid production. Ask your caregiver before starting or adding any new medicines.   HOME CARE INSTRUCTIONS   · Change the factors that you can control. Ask your caregiver for guidance concerning weight loss, quitting smoking, and alcohol consumption.  · Avoid foods and drinks that make your symptoms worse, such as:  ¨ Caffeine or alcoholic drinks.  ¨ Chocolate.  ¨ Peppermint or mint flavorings.  ¨ Garlic and onions.  ¨ Spicy foods.  ¨ Citrus fruits,  such as oranges, lemons, or limes.  ¨ Tomato-based foods such as sauce, chili, salsa, and pizza.  ¨ Fried and fatty foods.  · Avoid lying down for the 3 hours prior to your bedtime or prior to taking a nap.  · Eat small, frequent meals instead of large meals.  · Wear loose-fitting clothing. Do not wear anything tight around your waist that causes pressure on your stomach.  · Raise the head of your bed 6 to 8 inches with wood blocks to help you sleep. Extra pillows will not help.  · Only take over-the-counter or prescription medicines for pain, discomfort, or fever as directed by your caregiver.  · Do not take aspirin, ibuprofen, or other nonsteroidal anti-inflammatory drugs (NSAIDs).  SEEK IMMEDIATE MEDICAL CARE IF:   · You have pain in your arms, neck, jaw, teeth, or back.  · Your pain increases or changes in intensity or duration.  · You develop nausea, vomiting, or sweating (diaphoresis).  · You develop shortness of breath, or you faint.  · Your vomit is green, yellow, black, or looks like coffee grounds or blood.  · Your stool is red, bloody, or black.  These symptoms could be signs of other problems, such as heart disease, gastric bleeding, or esophageal bleeding.  MAKE SURE YOU:   · Understand these instructions.  · Will watch your condition.  · Will get help right away if you are not doing well or get worse.  Document Released: 12/29/2004 Document Revised: 06/13/2011 Document Reviewed: 10/08/2010  ExitCare® Patient   Information ©2015 [employer]. This information is not intended to replace advice given to you by your health care provider. Make sure you discuss any questions you have with your health care provider.  Food Choices for Gastroesophageal Reflux Disease  When you have gastroesophageal reflux disease (GERD), the foods you eat and your eating habits are very important. Choosing the right foods can help ease the discomfort of GERD.  WHAT GENERAL GUIDELINES DO I NEED TO FOLLOW?  · Choose fruits,  vegetables, whole grains, low-fat dairy products, and low-fat meat, fish, and poultry.  · Limit fats such as oils, salad dressings, butter, nuts, and avocado.  · Keep a food diary to identify foods that cause symptoms.  · Avoid foods that cause reflux. These may be different for different people.  · Eat frequent small meals instead of three large meals each day.  · Eat your meals slowly, in a relaxed setting.  · Limit fried foods.  · Cook foods using methods other than frying.  · Avoid drinking alcohol.  · Avoid drinking large amounts of liquids with your meals.  · Avoid bending over or lying down until 2-3 hours after eating.  WHAT FOODS ARE NOT RECOMMENDED?  The following are some foods and drinks that may worsen your symptoms:  Vegetables  Tomatoes. Tomato juice. Tomato and spaghetti sauce. Chili peppers. Onion and garlic. Horseradish.  Fruits  Oranges, grapefruit, and lemon (fruit and juice).  Meats  High-fat meats, fish, and poultry. This includes hot dogs, ribs, ham, sausage, salami, and bacon.  Dairy  Whole milk and chocolate milk. Sour cream. Cream. Butter. Ice cream. Cream cheese.   Beverages  Coffee and tea, with or without caffeine. Carbonated beverages or energy drinks.  Condiments  Hot sauce. Barbecue sauce.   Sweets/Desserts  Chocolate and cocoa. Donuts. Peppermint and spearmint.  Fats and Oils  High-fat foods, including French fries and potato chips.  Other  Vinegar. Strong spices, such as black pepper, white pepper, red pepper, cayenne, curry powder, cloves, ginger, and chili powder.  The items listed above may not be a complete list of foods and beverages to avoid. Contact your dietitian for more information.  Document Released: 03/21/2005 Document Revised: 03/26/2013 Document Reviewed: 01/23/2013  ExitCare® Patient Information ©2015 [employer]. This information is not intended to replace advice given to you by your health care provider. Make sure you discuss any questions you have with your  health care provider.

## 2014-06-12 NOTE — Progress Notes (Signed)
   Subjective:    Patient ID: [name], female    DOB: 1975-08-10, 39 y.o.   MRN: [id number]  Pt presents to the office for recurrent GERD and gas pains.  Heartburn She complains of abdominal pain, belching and heartburn. She reports no coughing, no dysphagia, no nausea or no sore throat. This is a chronic problem. The current episode started more than 1 year ago. The problem occurs frequently. The problem has been waxing and waning. The heartburn is located in the substernum. The heartburn wakes her from sleep. The symptoms are aggravated by certain foods, caffeine and lying down. She has tried a PPI for the symptoms. The treatment provided moderate relief.   *Pt has tried omeprazole and zantac OTC without relief. Pt states she still gets woke up at night with heartburn at times.    Review of Systems  Constitutional: Negative.   HENT: Negative.  Negative for sore throat.   Eyes: Negative.   Respiratory: Negative.  Negative for cough and shortness of breath.   Cardiovascular: Negative.  Negative for palpitations.  Gastrointestinal: Positive for heartburn and abdominal pain. Negative for dysphagia and nausea.  Endocrine: Negative.   Genitourinary: Negative.   Musculoskeletal: Negative.   Neurological: Negative.  Negative for headaches.  Hematological: Negative.   Psychiatric/Behavioral: Negative.   All other systems reviewed and are negative.      Objective:   Physical Exam  Constitutional: She is oriented to person, place, and time. She appears well-developed and well-nourished. No distress.  HENT:  Head: Normocephalic and atraumatic.  Right Ear: External ear normal.  Left Ear: External ear normal.  Nose: Nose normal.  Mouth/Throat: Oropharynx is clear and moist.  Eyes: Pupils are equal, round, and reactive to light.  Neck: Normal range of motion. Neck supple. No thyromegaly present.  Cardiovascular: Normal rate, regular rhythm, normal heart sounds and intact distal  pulses.   No murmur heard. Pulmonary/Chest: Effort normal and breath sounds normal. No respiratory distress. She has no wheezes.  Abdominal: Soft. Bowel sounds are normal. She exhibits no distension. There is no tenderness.  Musculoskeletal: Normal range of motion. She exhibits no edema or tenderness.  Neurological: She is alert and oriented to person, place, and time. She has normal reflexes. No cranial nerve deficit.  Skin: Skin is warm and dry.  Psychiatric: She has a normal mood and affect. Her behavior is normal. Judgment and thought content normal.  Vitals reviewed.   BP 117/77 mmHg  Pulse 82  Temp(Src) 97.6 F (36.4 C) (Oral)  Ht _0  (1.676 m)  Wt 124 lb 12.8 oz (56.609 kg)  BMI 20.15 kg/m2  LMP 11/27/2011  KUB- Constipation-  Preliminary reading by Evelina Dun, FNP [facility]      Assessment & Plan:  1. Gastroesophageal reflux disease, esophagitis presence not specified -Avoid spicy foods, caffeine, and alcohol -Do not eat past 7pm every night -RTO prn if symptoms do not improve - pantoprazole (PROTONIX) 40 MG tablet; Take 1 tablet (40 mg total) by mouth daily.  Dispense: 30 tablet; Refill: 3 - CMP14+EGFR - H Pylori, IGM, IGG, IGA AB  2. Generalized abdominal pain - DG Abd 1 View; Future - CMP14+EGFR - H Pylori, IGM, IGG, IGA AB  3. Constipation, unspecified constipation type -Health diet discussed -Miralax dialy  Evelina Dun, FNP

## 2014-06-14 LAB — CMP14+EGFR
ALT: 15 IU/L (ref 0–32)
AST: 17 IU/L (ref 0–40)
Albumin/Globulin Ratio: 1.7 (ref 1.1–2.5)
Albumin: 4.5 g/dL (ref 3.5–5.5)
Alkaline Phosphatase: 59 IU/L (ref 39–117)
BILIRUBIN TOTAL: 0.3 mg/dL (ref 0.0–1.2)
BUN / CREAT RATIO: 16 (ref 8–20)
BUN: 11 mg/dL (ref 6–20)
CO2: 25 mmol/L (ref 18–29)
Calcium: 9.6 mg/dL (ref 8.7–10.2)
Chloride: 101 mmol/L (ref 97–108)
Creatinine, Ser: 0.69 mg/dL (ref 0.57–1.00)
GFR calc non Af Amer: 110 mL/min/{1.73_m2} (ref >59)
GFR, EST AFRICAN AMERICAN: 127 mL/min/{1.73_m2} (ref >59)
Globulin, Total: 2.6 g/dL (ref 1.5–4.5)
Glucose: 69 mg/dL (ref 65–99)
POTASSIUM: 4 mmol/L (ref 3.5–5.2)
Sodium: 140 mmol/L (ref 134–144)
TOTAL PROTEIN: 7.1 g/dL (ref 6.0–8.5)

## 2014-06-14 LAB — H PYLORI, IGM, IGG, IGA AB
H Pylori IgG: <0.9 U/mL (ref 0.0–0.8)
H. pylori, IgM Abs: <9 units (ref 0.0–8.9)

## 2014-07-04 ENCOUNTER — Telehealth: Payer: Self-pay | Admitting: Family

## 2014-07-04 DIAGNOSIS — K2100 Gastro-esophageal reflux disease with esophagitis, without bleeding: Secondary | ICD-10-CM

## 2014-07-04 DIAGNOSIS — R1084 Generalized abdominal pain: Secondary | ICD-10-CM

## 2014-07-04 NOTE — Telephone Encounter (Signed)
GI referral sent

## 2014-07-09 ENCOUNTER — Encounter: Payer: Self-pay | Admitting: Gastroenterology

## 2014-07-29 ENCOUNTER — Encounter: Payer: Self-pay | Admitting: Gastroenterology

## 2014-07-29 ENCOUNTER — Encounter (INDEPENDENT_AMBULATORY_CARE_PROVIDER_SITE_OTHER): Payer: Self-pay

## 2014-07-29 ENCOUNTER — Ambulatory Visit (INDEPENDENT_AMBULATORY_CARE_PROVIDER_SITE_OTHER): Payer: BLUE CROSS/BLUE SHIELD | Admitting: Gastroenterology

## 2014-07-29 VITALS — BP 115/69 | HR 85 | Temp 97.2°F | Ht 65.0 in | Wt 128.0 lb

## 2014-07-29 DIAGNOSIS — K219 Gastro-esophageal reflux disease without esophagitis: Secondary | ICD-10-CM

## 2014-07-29 DIAGNOSIS — R1084 Generalized abdominal pain: Secondary | ICD-10-CM

## 2014-07-29 DIAGNOSIS — D069 Carcinoma in situ of cervix, unspecified: Secondary | ICD-10-CM | POA: Diagnosis not present

## 2014-07-29 DIAGNOSIS — K5900 Constipation, unspecified: Secondary | ICD-10-CM | POA: Diagnosis not present

## 2014-07-29 MED ORDER — LINACLOTIDE 145 MCG PO CAPS
145.0000 ug | ORAL_CAPSULE | Freq: Every day | ORAL | Status: DC
Start: 2014-07-29 — End: 2014-08-29

## 2014-07-29 NOTE — Progress Notes (Signed)
Primary Care Physician:  Sharion Balloon, FNP  Primary Gastroenterologist:  Barney Drain, MD   Chief Complaint  Patient presents with  . Abdominal Pain  . Gastrophageal Reflux  . Bloated    HPI:  [name] is a 39 y.o. female here at the request of PCP for further evaluation of abdominal pain/bloating and GERD.   Abdominal pain and bloating noted after hysterectomy in 2013 for cervical cancer. At the same time she was taking care of both parents with cancer subsequently succumbing to disease. Symptoms worse with meals. Has not been able to determine any specific food triggers. Sometimes tolerates a food and doesn't at other times. Symptoms may last for several days at a time but be pain free in between. Has tried Gas-X which helps with gas pain temporarily. Afraid to eat. Tried probiotic since December but not helpful. Pain is cramping in nature at time and sometimes associated with BM. Trial of Levbid helped RUQ pain but none of the other symptoms.   Chronic constipation, intermittent. Takes stool softeners as needed. Currently going regularly but feels inadequate. No melena, brbpr.   Heartburn became more frequent last year. Start omeprazole initially but now on pantoprazole which is controlling those symptoms. Heartburn is mostly in evening. No dysphagia.   Brother has "gluten allergy".   Current Outpatient Prescriptions  Medication Sig Dispense Refill  . hyoscyamine (LEVBID) 0.375 MG 12 hr tablet Take 1 tablet (0.375 mg total) by mouth 2 (two) times daily. 60 tablet 11  . Multiple Vitamins-Calcium (ONE-A-DAY WOMENS FORMULA PO) Take 1 tablet by mouth daily.      . pantoprazole (PROTONIX) 40 MG tablet Take 1 tablet (40 mg total) by mouth daily. 30 tablet 3  . Probiotic Product (ALIGN) 4 MG CAPS Take 1 capsule by mouth daily.    . VESICARE 5 MG tablet      No current facility-administered medications for this visit.    Allergies as of 07/29/2014  . (No Known Allergies)     Past Medical History  Diagnosis Date  . [facility]  . History of chicken pox   . Anemia   . Yeast vaginitis   . Dysplasia of cervix, low grade (CIN 1)   . Overactive bladder     MILD    Past Surgical History  Procedure Laterality Date  . Wisdom teethextraction    . Cervical conization w/bx  12/15/2010    Procedure: CONIZATION CERVIX WITH BIOPSY;  Surgeon: Delice Lesch, MD;  Location: [facility];  Service: Gynecology;  Laterality: N/A;  . Laparoscopic hysterectomy  11/30/2011    Procedure: HYSTERECTOMY TOTAL LAPAROSCOPIC;  Surgeon: Delice Lesch, MD;  Location: [facility];  Service: Gynecology;  Laterality: N/A;  with cysto    Family History  Problem Relation Age of Onset  . Cancer Father     lung cancer, deceased  . Cancer Mother     brain cancer, deceased  . Heart disease Maternal Grandmother   . Heart disease Paternal Grandfather     CHF   . Emphysema Paternal Grandmother   . Diabetes Paternal Grandfather   . Diabetes Maternal Grandfather   . Stroke Paternal Grandfather   . Leukemia Paternal Uncle   . Colon cancer Neg Hx     History   Social History  . Marital Status: Married    Spouse Name: N/A  . Number of Children: 1  . Years of Education: N/A   Occupational  History  . unemployed    Social History Main Topics  . Smoking status: Never Smoker   . Smokeless tobacco: Not on file  . Alcohol Use: 0.6 oz/week    1 Standard drinks or equivalent per week     Comment: rare etoh use  . Drug Use: No  . Sexual Activity:    Partners: Male     Comment: Hysterectomy   Other Topics Concern  . Not on file   Social History Narrative      ROS:  General: Negative for anorexia, weight loss, fever, chills, fatigue, weakness. Eyes: Negative for vision changes.  ENT: Negative for hoarseness, difficulty swallowing , nasal congestion. CV: Negative for chest pain, angina, palpitations, dyspnea on exertion, peripheral  edema.  Respiratory: Negative for dyspnea at rest, dyspnea on exertion, cough, sputum, wheezing.  GI: See history of present illness. GU:  Negative for dysuria, hematuria, urinary incontinence, urinary frequency, nocturnal urination.  MS: Negative for joint pain, low back pain.  Derm: Negative for rash or itching.  Neuro: Negative for weakness, abnormal sensation, seizure, frequent headaches, memory loss, confusion.  Psych: Negative for anxiety, depression, suicidal ideation, hallucinations.  Endo: Negative for unusual weight change.  Heme: Negative for bruising or bleeding. Allergy: Negative for rash or hives.    Physical Examination:  BP 115/69 mmHg  Pulse 85  Temp(Src) 97.2 F (36.2 C)  Ht 5\' 5"  (1.651 m)  Wt 128 lb (58.06 kg)  BMI 21.30 kg/m2  LMP 11/27/2011   General: Well-nourished, well-developed in no acute distress.  Head: Normocephalic, atraumatic.   Eyes: Conjunctiva pink, no icterus. Mouth: Oropharyngeal mucosa moist and pink , no lesions erythema or exudate. Neck: Supple without thyromegaly, masses, or lymphadenopathy.  Lungs: Clear to auscultation bilaterally.  Heart: Regular rate and rhythm, no murmurs rubs or gallops.  Abdomen: Bowel sounds are normal,diffuse lower abdominal tenderness, nondistended, no hepatosplenomegaly or masses, no abdominal bruits or    hernia , no rebound or guarding.   Rectal: not performed Extremities: No lower extremity edema. No clubbing or deformities.  Neuro: Alert and oriented x 4 , grossly normal neurologically.  Skin: Warm and dry, no rash or jaundice.   Psych: Alert and cooperative, normal mood and affect.  Labs: Lab Results  Component Value Date   CREATININE 0.69 06/12/2014   BUN 11 06/12/2014   NA 140 06/12/2014   K 4.0 06/12/2014   CL 101 06/12/2014   CO2 25 06/12/2014   Lab Results  Component Value Date   ALT 15 06/12/2014   AST 17 06/12/2014   ALKPHOS 59 06/12/2014   BILITOT 0.3 06/12/2014        Ref Range  41mo ago    H Pylori IgG 0.0 - 0.8 U/mL <0.9   Comments:               Negative      <0.9                Indeterminate 0.9 - 1.0                Positive      >1.0     H. pylori, IgA Abs 0.0 - 8.9 units <9.0   Comments:                Negative     <9.0                  Equivocal  9.0 - 11.0  Positive     >11.0     H. pylori, IgM Abs 0.0 - 8.9 units <9.0   Comments:                Negative     <9.0                  Equivocal  9.0 - 11.0                  Positive     >11.0  This test was developed and its performance  characteristics determined by [facility]. It has not been  cleared or approved by the Food and Drug Administration.  Results of this test are for investigational purposes  only. The result should not be used as a diagnostic  procedure without confirmation of the diagnosis by  another medically diagnostic product or procedure.     Resulting Agency [facility]     Narrative     Performed at: [facility] [address], [place], [place] [id number] Lab Director: Lindon Romp MD, Phone: [phone]       Specimen Collected: 06/12/14 10:29 AM Last Resulted: 06/14/14 6:36 AM          Ref Range 46mo ago    Antigliadin Abs, IgA 0 - 19 units 2   Comments:          Negative          0 - 19           Weak Positive       20 - 30           Moderate to Strong Positive  >30     Transglutaminase IgA 0 - 3 U/mL <2   Comments:               Negative    0 - 3                 Weak Positive  4 - 10                 Positive      >10  Tissue Transglutaminase (tTG) has been identified  as the endomysial antigen. Studies have demonstr-  ated that endomysial IgA  antibodies have over 99%  specificity for gluten sensitive enteropathy.     Resulting Agency [facility]     Narrative     Performed at: [facility] [address] [place], [place] [id number] Lab Director: Lindon Romp MD, Phone: [phone]       Specimen Collected: 03/06/14 11:28 AM Last Resulted: 03/10/14 6:35 AM                        Imaging Studies:   Abd 1 view on 06/12/2014  CLINICAL DATA: Increasing abdominal pain over 2 years.  EXAM: ABDOMEN - 1 VIEW  COMPARISON: None  FINDINGS: There is not moderate volume of stool in the colon and rectum. No dilated loops of large or small bowel. No organomegaly. No pathologic calcifications. No osseous abnormality.  IMPRESSION: Moderate volume stool. No bowel obstruction.   Electronically Signed  By: Suzy Bouchard M.D.  On: 06/12/2014 13:37

## 2014-07-29 NOTE — Patient Instructions (Signed)
Continue pantoprazole once daily before breakfast.  Start Linzess once daily before breakfast (take on empty stomach and wait for 30 minutes before eating). Samples provided and RX sent to your pharmacy.   CT scan of your abdomen. We will call with further recommendations once results are available.   Gastroesophageal Reflux Disease, Adult Gastroesophageal reflux disease (GERD) happens when acid from your stomach flows up into the esophagus. When acid comes in contact with the esophagus, the acid causes soreness (inflammation) in the esophagus. Over time, GERD may create small holes (ulcers) in the lining of the esophagus. CAUSES   Increased body weight. This puts pressure on the stomach, making acid rise from the stomach into the esophagus.  Smoking. This increases acid production in the stomach.  Drinking alcohol. This causes decreased pressure in the lower esophageal sphincter (valve or ring of muscle between the esophagus and stomach), allowing acid from the stomach into the esophagus.  Late evening meals and a full stomach. This increases pressure and acid production in the stomach.  A malformed lower esophageal sphincter. Sometimes, no cause is found. SYMPTOMS   Burning pain in the lower part of the mid-chest behind the breastbone and in the mid-stomach area. This may occur twice a week or more often.  Trouble swallowing.  Sore throat.  Dry cough.  Asthma-like symptoms including chest tightness, shortness of breath, or wheezing. DIAGNOSIS  Your caregiver may be able to diagnose GERD based on your symptoms. In some cases, X-rays and other tests may be done to check for complications or to check the condition of your stomach and esophagus. TREATMENT  Your caregiver may recommend over-the-counter or prescription medicines to help decrease acid production. Ask your caregiver before starting or adding any new medicines.  HOME CARE INSTRUCTIONS   Change the factors that you can  control. Ask your caregiver for guidance concerning weight loss, quitting smoking, and alcohol consumption.  Avoid foods and drinks that make your symptoms worse, such as:  Caffeine or alcoholic drinks.  Chocolate.  Peppermint or mint flavorings.  Garlic and onions.  Spicy foods.  Citrus fruits, such as oranges, lemons, or limes.  Tomato-based foods such as sauce, chili, salsa, and pizza.  Fried and fatty foods.  Avoid lying down for the 3 hours prior to your bedtime or prior to taking a nap.  Eat small, frequent meals instead of large meals.  Wear loose-fitting clothing. Do not wear anything tight around your waist that causes pressure on your stomach.  Raise the head of your bed 6 to 8 inches with wood blocks to help you sleep. Extra pillows will not help.  Only take over-the-counter or prescription medicines for pain, discomfort, or fever as directed by your caregiver.  Do not take aspirin, ibuprofen, or other nonsteroidal anti-inflammatory drugs (NSAIDs). SEEK IMMEDIATE MEDICAL CARE IF:   You have pain in your arms, neck, jaw, teeth, or back.  Your pain increases or changes in intensity or duration.  You develop nausea, vomiting, or sweating (diaphoresis).  You develop shortness of breath, or you faint.  Your vomit is green, yellow, black, or looks like coffee grounds or blood.  Your stool is red, bloody, or black. These symptoms could be signs of other problems, such as heart disease, gastric bleeding, or esophageal bleeding. MAKE SURE YOU:   Understand these instructions.  Will watch your condition.  Will get help right away if you are not doing well or get worse. Document Released: 12/29/2004 Document Revised: 06/13/2011 Document Reviewed:  10/08/2010 Exit[employer] Patient Information 2015 [place], [place]. This information is not intended to replace advice given to you by your health care provider. Make sure you discuss any questions you have with your health  care provider.    Irritable Bowel Syndrome Irritable bowel syndrome (IBS) is caused by a disturbance of normal bowel function and is a common digestive disorder. You may also hear this condition called spastic colon, mucous colitis, and irritable colon. There is no cure for IBS. However, symptoms often gradually improve or disappear with a good diet, stress management, and medicine. This condition usually appears in late adolescence or early adulthood. Women develop it twice as often as men. CAUSES  After food has been digested and absorbed in the small intestine, waste material is moved into the large intestine, or colon. In the colon, water and salts are absorbed from the undigested products coming from the small intestine. The remaining residue, or fecal material, is held for elimination. Under normal circumstances, gentle, rhythmic contractions of the bowel walls push the fecal material along the colon toward the rectum. In IBS, however, these contractions are irregular and poorly coordinated. The fecal material is either retained too long, resulting in constipation, or expelled too soon, producing diarrhea. SIGNS AND SYMPTOMS  The most common symptom of IBS is abdominal pain. It is often in the lower left side of the abdomen, but it may occur anywhere in the abdomen. The pain comes from spasms of the bowel muscles happening too much and from the buildup of gas and fecal material in the colon. This pain:  Can range from sharp abdominal cramps to a dull, continuous ache.  Often worsens soon after eating.  Is often relieved by having a bowel movement or passing gas. Abdominal pain is usually accompanied by constipation, but it may also produce diarrhea. The diarrhea often occurs right after a meal or upon waking up in the morning. The stools are often soft, watery, and flecked with mucus. Other symptoms of IBS include:  Bloating.  Loss of appetite.  Heartburn.  Backache.  Dull pain in  the arms or shoulders.  Nausea.  Burping.  Vomiting.  Gas. IBS may also cause symptoms that are unrelated to the digestive system, such as:  Fatigue.  Headaches.  Anxiety.  Shortness of breath.  Trouble concentrating.  Dizziness. These symptoms tend to come and go. DIAGNOSIS  The symptoms of IBS may seem like symptoms of other, more serious digestive disorders. Your health care provider may want to perform tests to exclude these disorders.  TREATMENT Many medicines are available to help correct bowel function or relieve bowel spasms and abdominal pain. Among the medicines available are:  Laxatives for severe constipation and to help restore normal bowel habits.  Specific antidiarrheal medicines to treat severe or lasting diarrhea.  Antispasmodic agents to relieve intestinal cramps. Your health care provider may also decide to treat you with a mild tranquilizer or sedative during unusually stressful periods in your life. Your health care provider may also prescribe antidepressant medicine. The use of this medicine has been shown to reduce pain and other symptoms of IBS. Remember that if any medicine is prescribed for you, you should take it exactly as directed. Make sure your health care provider knows how well it worked for you. HOME CARE INSTRUCTIONS   Take all medicines as directed by your health care provider.  Avoid foods that are high in fat or oils, such as heavy cream, butter, frankfurters, sausage, and other fatty meats.  Avoid foods that make you go to the bathroom, such as fruit, fruit juice, and dairy products.  Cut out carbonated drinks, chewing gum, and "gassy" foods such as beans and cabbage. This may help relieve bloating and burping.  Eat foods with bran, and drink plenty of liquids with the bran foods. This helps relieve constipation.  Keep track of what foods seem to bring on your symptoms.  Avoid emotionally charged situations or circumstances that  produce anxiety.  Start or continue exercising.  Get plenty of rest and sleep. Document Released: 03/21/2005 Document Revised: 03/26/2013 Document Reviewed: 11/09/2007 [facility] Patient Information 2015 [place], [place]. This information is not intended to replace advice given to you by your health care provider. Make sure you discuss any questions you have with your health care provider.

## 2014-07-29 NOTE — Progress Notes (Signed)
Insurance would like more information from the provider. The phone number to call is [phone]. You will need to give them the ID#- [id number] and there name and date of birth. They will not approve the CT A/P.

## 2014-07-31 NOTE — Assessment & Plan Note (Signed)
Constipation and abdominal bloating. Recent celiac serologies negative. Personal history of cervical cancer s/p partial hysterectomy. Treat constipation. Start Linzess 161mcg daily. Hold for diarrhea. Stop Levbid.

## 2014-07-31 NOTE — Assessment & Plan Note (Signed)
Abdominal pain involving lower abd and RUQ. Postprandial component. H.pylori serologies negative. Associated with constipation. Personal history of cervical cancer. Recommend CT A/P with contrast. Further recommendations to follow.

## 2014-07-31 NOTE — Assessment & Plan Note (Signed)
Typical symptoms well-controlled on pantoprazole daily. Discussed antireflux measures.

## 2014-08-05 NOTE — Progress Notes (Signed)
CC'ED TO PCP 

## 2014-08-12 ENCOUNTER — Telehealth: Payer: Self-pay

## 2014-08-12 NOTE — Telephone Encounter (Signed)
Please see phone note from 08/01/14 @ 11:45 about her insurance not coving the CT A/P.

## 2014-08-12 NOTE — Telephone Encounter (Signed)
Ginger, please find out where patient had CT and get copy of results. They are not in EPIC and I don't have any records in my box. Thank you.

## 2014-08-12 NOTE — Telephone Encounter (Signed)
OK. I never received this addendum to my OV regarding CT not being approved. I will call them tomorrow.

## 2014-08-12 NOTE — Telephone Encounter (Signed)
Pt is calling about the CT A/P. I told her that LSL was seeing patients this morning but we would get back with her when we could. Please advise

## 2014-08-13 NOTE — Telephone Encounter (Signed)
I talked with insurance company(Erica) and they asked me to fax over some information.Erica's direct number is [phone].

## 2014-08-13 NOTE — Telephone Encounter (Signed)
[name] left message with insurance company to call back regarding CT.

## 2014-08-15 NOTE — Telephone Encounter (Signed)
Insurance is still pending and pt is aware that we are waiting on the insurance

## 2014-08-15 NOTE — Telephone Encounter (Signed)
Do we know anything yet? Let's make sure patient is aware of the status.

## 2014-08-18 NOTE — Telephone Encounter (Signed)
Pt is aware.  

## 2014-08-18 NOTE — Telephone Encounter (Signed)
Spoke with Danae Chen @ [employer]  Ct approved. PA # [id number] expires 09/16/2014. Pt is aware and is set for 08/21/2014 @ 1245 pm.

## 2014-08-18 NOTE — Telephone Encounter (Signed)
Heartburn is not a listed side effect of Linzess. Can take zantac 150mg  up to BID prn or TUMS per label in addition to her pantoprazole for breakthrough or occasional symptoms.   Glad to see CT is approved!

## 2014-08-18 NOTE — Telephone Encounter (Signed)
Pt called and states that she is having heartburn and it has been worse over the last couple of days. Wants to know if the Linzess may be causing it. Pt is also asking about the CT scan.

## 2014-08-21 ENCOUNTER — Other Ambulatory Visit (HOSPITAL_COMMUNITY): Payer: BLUE CROSS/BLUE SHIELD

## 2014-08-22 ENCOUNTER — Ambulatory Visit (HOSPITAL_COMMUNITY)
Admission: RE | Admit: 2014-08-22 | Discharge: 2014-08-22 | Disposition: A | Discharge status: Home / Self Care | Payer: BLUE CROSS/BLUE SHIELD | Source: Ambulatory Visit | Attending: Gastroenterology | Admitting: Gastroenterology

## 2014-08-22 DIAGNOSIS — G8929 Other chronic pain: Secondary | ICD-10-CM | POA: Insufficient documentation

## 2014-08-22 DIAGNOSIS — R1084 Generalized abdominal pain: Secondary | ICD-10-CM

## 2014-08-22 DIAGNOSIS — R1011 Right upper quadrant pain: Secondary | ICD-10-CM | POA: Diagnosis present

## 2014-08-22 DIAGNOSIS — K219 Gastro-esophageal reflux disease without esophagitis: Secondary | ICD-10-CM

## 2014-08-22 DIAGNOSIS — D069 Carcinoma in situ of cervix, unspecified: Secondary | ICD-10-CM

## 2014-08-22 DIAGNOSIS — K5900 Constipation, unspecified: Secondary | ICD-10-CM

## 2014-08-22 MED ORDER — IOHEXOL 300 MG/ML  SOLN
100.0000 mL | Freq: Once | INTRAMUSCULAR | Status: AC | PRN
  Administered 2014-08-22: 100 mL via INTRAVENOUS

## 2014-08-24 NOTE — Progress Notes (Signed)
Quick Note:  Please let patient know her CT was negative. How is she doing now on Linzess? Is she still having RUQ pain? Is she still having lower abd pain and constipaiton?lew  ______

## 2014-08-25 NOTE — Progress Notes (Signed)
Quick Note:  PT is aware of her results. She said she is still having the same pain, intermittent and at times worse than other times.  She is taking the Linzess qd. Said she did not get diarrhea. She has BM's sometimes everyday and then sometimes she misses and takes a stool softner in addition. Said she just feels like she is back the beginning of her problem and no better really. Please advise! ______

## 2014-08-29 ENCOUNTER — Other Ambulatory Visit: Payer: Self-pay | Admitting: Gastroenterology

## 2014-08-29 MED ORDER — LINACLOTIDE 290 MCG PO CAPS
290.0000 ug | ORAL_CAPSULE | Freq: Every day | ORAL | Status: DC
Start: 2014-08-29 — End: 2015-04-20

## 2014-08-29 NOTE — Progress Notes (Signed)
Quick Note:  Pt is aware. ______ 

## 2014-08-29 NOTE — Progress Notes (Signed)
Quick Note:  Please tell patient I would like to try her to increase Linzess to 267mcg daily. I sent in a new RX I will discuss with Dr. Oneida Alar next week when she gets back from vacation. Patient may need EGD/colonoscopy but I want to discuss with SLF first. ______

## 2014-09-02 NOTE — Progress Notes (Signed)
REVIEWED LABS/CT-PT LIKELY HAS IBS-C AND/OR SIBO. EGD FOR DYSPEPSIA. AVOID DAIRY FOR 3 MOS.CONTINUE PROBIOTIC AND LINZESS 290 MCG QD. IF NO SOURCE FOR DYSPEPSIA IDENTIFIED BY EGD, THEN PT NEEDS HBT FOR SIBO. OPV IN 3 MOS W/ SLF E30 DYSPEPSIA.

## 2014-09-08 NOTE — Progress Notes (Signed)
Please let patient know that Dr. Oneida Alar has reviewed her CT/labs/etc.   She recommends EGD (for dyspepsia). Please schedule.  Recommends avoid dairy for 3 months. Continue Probiotic. Continue Linzess 232mcg daily. OV in 3 months with SLF E30 Dyspepsia.   If EGD is negative then she will plan for HBT for small bowel bacterial overgrowth.

## 2014-09-12 NOTE — Progress Notes (Signed)
LMOM to call.

## 2014-09-22 NOTE — Progress Notes (Signed)
I called and informed pt. She said she is doing better, has a not so good day every now and then.  She does not have a sitter for her child so she can have EGD done.  Family is either deceased or lives miles away.  Her husband is the only source for income and he cannot take off for her to go to the hospital. She would like to know approximately what an EGD would cost.  She said they made her pay $1500 for the CT.  She will try to think of a time if it seems necessary to do, but she would like to know the cost.

## 2014-09-22 NOTE — Progress Notes (Signed)
PT is aware and will discuss with her husband and let us know when she is ready to schedule .

## 2014-09-22 NOTE — Progress Notes (Signed)
It cost around $510 for a EGD only

## 2014-09-25 ENCOUNTER — Other Ambulatory Visit: Payer: Self-pay | Admitting: Family

## 2014-09-25 NOTE — Progress Notes (Signed)
OK let's make sure she has ov with slf in 3 months.

## 2014-09-25 NOTE — Progress Notes (Signed)
Reminder in epic to follow up in 3 months with SF

## 2014-09-30 ENCOUNTER — Telehealth: Payer: Self-pay

## 2014-09-30 NOTE — Telephone Encounter (Signed)
OK. Have her call back end of July. However, if her symptoms resolve she may not need EGD.

## 2014-09-30 NOTE — Telephone Encounter (Signed)
Pt called and said she thinks she is ready to schedule the EGD, but her symptoms have improved. She does not want it scheduled until August, because school is out now and she can't schedule while she has her son at home. I told her I do not schedule that far in advance and to all back at the end of July if she wants appt in August.

## 2014-10-09 NOTE — Telephone Encounter (Signed)
Letter reminder mailed to pt to call by the end of July if she needs the EGD in August.

## 2014-11-03 ENCOUNTER — Encounter: Payer: Self-pay | Admitting: Gastroenterology

## 2014-12-30 ENCOUNTER — Other Ambulatory Visit: Payer: Self-pay | Admitting: Family

## 2015-01-01 ENCOUNTER — Other Ambulatory Visit: Payer: Self-pay

## 2015-01-01 ENCOUNTER — Encounter: Payer: Self-pay | Admitting: Gastroenterology

## 2015-01-01 ENCOUNTER — Ambulatory Visit (INDEPENDENT_AMBULATORY_CARE_PROVIDER_SITE_OTHER): Payer: BLUE CROSS/BLUE SHIELD | Admitting: Gastroenterology

## 2015-01-01 VITALS — BP 109/68 | HR 76 | Temp 97.9°F | Ht 66.0 in | Wt 123.6 lb

## 2015-01-01 DIAGNOSIS — K219 Gastro-esophageal reflux disease without esophagitis: Secondary | ICD-10-CM

## 2015-01-01 DIAGNOSIS — R1013 Epigastric pain: Secondary | ICD-10-CM

## 2015-01-01 MED ORDER — PANTOPRAZOLE SODIUM 40 MG PO TBEC: DELAYED_RELEASE_TABLET | ORAL | Status: DC

## 2015-01-01 NOTE — Progress Notes (Signed)
Subjective:    Patient ID: LY WASS, female    DOB: [birth year], 39 y.o.   MRN: [id number]  Evelina Dun, FNP  HPI SYMPTOMS STARTED 3 YRS AGO AFTER HER HYSTERECTOMY. Did well FROM MAY-AUG(1X/Q3-4 WEEKS). HAS PAIN IN ABDOMEN-SOMETIMES UPPER AND SOMETIMES LOWER. HAS HEARTBURN OFF AND ON. FEELS FINE ONE DAY AND THEN NEXT DAY DOESN'T FEEL GOOD. LOSES APPETITE BECAUSE ABD FEELS FULL. LOSES A FEW POUNDS. PROTONIX NOT IDEALLY CONTROLLING HER SYMPTOMS-KEPT AWAKE BY BURNING AND[AIN IN CHEST AND CAN FEEL IT IN NECK AND JAW AREA. MAY HAVE TO TAKE ALKA SELTZER, ROLAIDS, OR WHAT'S AVAILABLE. TRIGGERS: DOESN'T EAT OUT. OVER SUMMER ATE WHAT SHE WANTED. AVOID FRIED FOODS AND CAFFEINE. RARE CHOCOLATE. EATS COOKED ONIONS(1-2X/WEEK). NO GARLIC EVERY DAY. LAST NIGHT FOR DINNER: SQUASH WITH TOMATOES, GARLIC POWDER. MAY HAVE A STEADY ACHE IN [place]. FEELS WORSE WHEN SHE FEELS FULL NOT BLOATED. LINZESS HELPING HER HAVE MORE REGULAR BOWEL MOVEMENTS BUT NOT EVERY DAY. STRESS: HAS HUSBAND, SON, 2 STEP CHILDREN(1 EVERY OTHER WEEKEND). 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 WORRIES ABOUT HER SON (1ST GRADE) AND OTHER THINGS. HUSBAND STRESSES ABOUT HIS JOB/MONEY AND LEFT HIS WELL PAYING JOB. ALIGN DIDN'T CHANGE SX AFTER 2-3 MOS. HAS DAYS WHEN SHE FEELS LIKE SHE HAS TO URINATE ALL DAY LONG. MAY BURNING AND PAIN IN SUPRAPUBIC REGION. USUALLY HITS HER AT NIGHT. THOUGHT SHE HAD OVERACTIVE BLADDER. HEARTBURN: AT NIGHT OR BEDTIME, CAN BE AWAKENED IN MIDDLE OF NIGHT, SEVERAL DAYS NONE THEN 1 WEEK Q2-3 DAYS. HUSBAND FREAKS OUT IF HE DOESN'T HAVE A BM DAILY.  PT DENIES FEVER, CHILLS, HEMATOCHEZIA, nausea, vomiting, melena, diarrhea, SHORTNESS OF BREATH, CHANGE IN BOWEL IN HABITS, constipation,  problems swallowing, OR problems with sedation.  Past Medical History  Diagnosis Date  . [facility]  . History of chicken pox   . Anemia   . Yeast vaginitis   . Dysplasia of cervix,  low grade (CIN 1)   . Overactive bladder     MILD   Past Surgical History  Procedure Laterality Date  . Wisdom teethextraction    . Cervical conization w/bx  12/15/2010    Procedure: CONIZATION CERVIX WITH BIOPSY;  Surgeon: Delice Lesch, MD;  Location: [facility];  Service: Gynecology;  Laterality: N/A;  . Laparoscopic hysterectomy  11/30/2011    Procedure: HYSTERECTOMY TOTAL LAPAROSCOPIC;  Surgeon: Delice Lesch, MD;  Location: [facility];  Service: Gynecology;  Laterality: N/A;  with cysto   No Known Allergies  Current Outpatient Prescriptions  Medication Sig Dispense Refill  . Linaclotide (LINZESS) 290 MCG CAPS capsule Take 1 capsule (290 mcg total) by mouth daily.    . Multiple Vitamins-Calcium (ONE-A-DAY WOMENS FORMULA PO) Take 1 tablet by mouth daily.      Marland Kitchen oxybutynin (DITROPAN) 5 MG tablet TAKE 1 TABLET(S) TWO-THREE TIMES A DAY    . pantoprazole (PROTONIX) 40 MG tablet TAKE 1 TABLET (40 MG TOTAL) BY MOUTH DAILY.    .      Laurin Coder 5 MG tablet       Family History  Problem Relation Age of Onset  . Cancer Father     lung cancer, deceased  . Cancer Mother     brain cancer, deceased  . Heart disease Maternal Grandmother   . Heart disease Paternal Grandfather     CHF   . Emphysema Paternal Grandmother   . Diabetes Paternal Grandfather   . Diabetes Maternal Grandfather   .  Stroke Paternal Grandfather   . Leukemia Paternal Uncle   . Colon cancer Neg Hx    Social History  Substance Use Topics  . Smoking status: Never Smoker   . Smokeless tobacco: None  . Alcohol Use: 0.6 oz/week    1 Standard drinks or equivalent per week     Comment: rare etoh use  BROTHER LIVES IN [place], BUT THEY DON'T COMMUNICATE THAT MUCH. INHERITED HOUSE FROM PARENTS. THEY DIED 8 MOS APART.  Review of Systems PER HPI OTHERWISE ALL SYSTEMS ARE NEGATIVE.    Objective:   Physical Exam  Constitutional: She is oriented to person, place, and time. She appears well-developed and well-nourished. No  distress.  HENT:  Head: Normocephalic and atraumatic.  Mouth/Throat: Oropharynx is clear and moist. No oropharyngeal exudate.  Eyes: Pupils are equal, round, and reactive to light. No scleral icterus.  Neck: Normal range of motion. Neck supple.  Cardiovascular: Normal rate, regular rhythm and normal heart sounds.   Pulmonary/Chest: Effort normal and breath sounds normal. No respiratory distress.  Abdominal: Soft. Bowel sounds are normal. She exhibits no distension. There is no tenderness.  Musculoskeletal: She exhibits no edema.  Lymphadenopathy:    She has no cervical adenopathy.  Neurological: She is alert and oriented to person, place, and time.  Psychiatric: She has a normal mood and affect.  Vitals reviewed.     Assessment & Plan:

## 2015-01-01 NOTE — Patient Instructions (Signed)
DRINK WATER TO KEEP YOUR URINE LIGHT YELLOW.  AVOID FOOD THAT TRIGGERS REFLUX. SEE INFO BELOW.   FOLLOW A LOW FAT DIET. MEATS SHOULD BE BAKED, BROILED, OR BOILED.  CONTINUE PROTONIX. TAKE 30 MINUTES PRIOR TO MEALS ONCE OR TWICE DAILY.  UPPER ENDOSCOPY IN 3 WEEKS TO EVALUATE YOUR CHEST AND UPPER ABDOMINAL PAIN.  FOLLOW UP IN 4 MOS.    Lifestyle and home remedies TO CONTROL REFLUX  You may eliminate or reduce the frequency of heartburn by making the following lifestyle changes:  . Control your weight. Being overweight is a major risk factor for heartburn and GERD. Excess pounds put pressure on your abdomen, pushing up your stomach and causing acid to back up into your esophagus.   .  Eat smaller meals. 4 TO 6 MEALS A DAY. This reduces pressure on the lower esophageal sphincter, helping to prevent the valve from opening and acid from washing back into your esophagus.   Dolphus Jenny your belt. Clothes that fit tightly around your waist put pressure on your abdomen and the lower esophageal sphincter.  .  . Eliminate heartburn triggers. Everyone has specific triggers. Common triggers such as fatty or fried foods, spicy food, tomato sauce, carbonated beverages, alcohol, chocolate, mint, garlic, onion, caffeine and nicotine may make heartburn worse.   Marland Kitchen Avoid stooping or bending. Tying your shoes is OK. Bending over for longer periods to weed your garden isn't, especially soon after eating.   . Don't lie down after a meal. Wait at least three to four hours after eating before going to bed, and don't lie down right after eating.   Alternative medicine . Several home remedies exist for treating GERD, but they provide only temporary relief. They include drinking baking soda (sodium bicarbonate) added to water or drinking other fluids such as baking soda mixed with cream of tartar and water. . Although these liquids create temporary relief by neutralizing, washing away or buffering acids, eventually  they aggravate the situation by adding gas and fluid to your stomach, increasing pressure and causing more acid reflux. Further, adding more sodium to your diet may increase your blood pressure and add stress to your heart, and excessive bicarbonate ingestion can alter the acid-base balance in your body.

## 2015-01-01 NOTE — Progress Notes (Signed)
CC'D TO PCP °

## 2015-01-01 NOTE — Assessment & Plan Note (Signed)
SYMPTOMS NOT IDEALLY CONTROLLED & LIKELY EXACERBATED BY LIFESTYLE FACTORS AND/OR H PYLORI GASTRITIS.   DRINK WATER TO KEEP YOUR URINE LIGHT YELLOW. AVOID FOOD THAT TRIGGERS REFLUX.  HANDOUT GIVEN. FOLLOW A LOW FAT DIET. MEATS SHOULD BE BAKED, BROILED, OR BOILED. CONTINUE PROTONIX. TAKE 30 MINUTES PRIOR TO MEALS ONCE OR TWICE DAILY. UPPER ENDOSCOPY IN 3 WEEKS TO EVALUATE YOUR CHEST AND UPPER ABDOMINAL PAIN. DISCUSSED PROCEDURE, BENEFITS, & RISKS: < 1% chance of medication reaction, OR bleeding.  FOLLOW UP IN 4 MOS.   GREATER THAN 50% WAS SPENT IN COUNSELING & COORDINATION OF CARE WITH THE PATIENT: DISCUSSED DIFFERENTIAL DIAGNOSIS, PROCEDURE, BENEFITS, RISKS, AND MANAGEMENT OF GERD/EGD. TOTAL ENCOUNTER TIME: 25 MINS.

## 2015-01-01 NOTE — Progress Notes (Signed)
ON RECALL  °

## 2015-01-23 ENCOUNTER — Encounter (HOSPITAL_COMMUNITY)
Admission: RE | Disposition: A | Discharge status: Home / Self Care | Payer: Self-pay | Source: Ambulatory Visit | Attending: Gastroenterology

## 2015-01-23 ENCOUNTER — Encounter (HOSPITAL_COMMUNITY): Payer: Self-pay | Admitting: *Deleted

## 2015-01-23 ENCOUNTER — Ambulatory Visit (HOSPITAL_COMMUNITY)
Admission: RE | Admit: 2015-01-23 | Discharge: 2015-01-23 | Disposition: A | Discharge status: Home / Self Care | Payer: BLUE CROSS/BLUE SHIELD | Source: Ambulatory Visit | Attending: Gastroenterology | Admitting: Gastroenterology

## 2015-01-23 DIAGNOSIS — K2970 Gastritis, unspecified, without bleeding: Secondary | ICD-10-CM

## 2015-01-23 DIAGNOSIS — R1013 Epigastric pain: Secondary | ICD-10-CM | POA: Insufficient documentation

## 2015-01-23 DIAGNOSIS — Z90710 Acquired absence of both cervix and uterus: Secondary | ICD-10-CM | POA: Insufficient documentation

## 2015-01-23 DIAGNOSIS — K2950 Unspecified chronic gastritis without bleeding: Secondary | ICD-10-CM | POA: Diagnosis not present

## 2015-01-23 DIAGNOSIS — K219 Gastro-esophageal reflux disease without esophagitis: Secondary | ICD-10-CM | POA: Diagnosis not present

## 2015-01-23 DIAGNOSIS — K581 Irritable bowel syndrome with constipation: Secondary | ICD-10-CM | POA: Insufficient documentation

## 2015-01-23 HISTORY — PX: ESOPHAGOGASTRODUODENOSCOPY: SHX5428

## 2015-01-23 SURGERY — EGD (ESOPHAGOGASTRODUODENOSCOPY)
Anesthesia: Moderate Sedation

## 2015-01-23 MED ORDER — STERILE WATER FOR IRRIGATION IR SOLN
Status: DC | PRN
  Administered 2015-01-23: 10:00:00

## 2015-01-23 MED ORDER — MIDAZOLAM HCL 5 MG/5ML IJ SOLN
INTRAMUSCULAR | Status: AC
  Filled 2015-01-23: qty 10

## 2015-01-23 MED ORDER — PROMETHAZINE HCL 25 MG/ML IJ SOLN
12.5000 mg | Freq: Once | INTRAMUSCULAR | Status: AC
  Administered 2015-01-23: 12.5 mg via INTRAVENOUS

## 2015-01-23 MED ORDER — MIDAZOLAM HCL 5 MG/5ML IJ SOLN
INTRAMUSCULAR | Status: DC | PRN
  Administered 2015-01-23 (×2): 2 mg via INTRAVENOUS
  Administered 2015-01-23: 1 mg via INTRAVENOUS

## 2015-01-23 MED ORDER — MEPERIDINE HCL 100 MG/ML IJ SOLN
INTRAMUSCULAR | Status: DC | PRN
  Administered 2015-01-23: 50 mg via INTRAVENOUS

## 2015-01-23 MED ORDER — PROMETHAZINE HCL 25 MG/ML IJ SOLN
INTRAMUSCULAR | Status: AC
  Filled 2015-01-23: qty 1

## 2015-01-23 MED ORDER — LIDOCAINE VISCOUS 2 % MT SOLN
OROMUCOSAL | Status: AC
  Filled 2015-01-23: qty 15

## 2015-01-23 MED ORDER — SODIUM CHLORIDE 0.9 % IV SOLN
INTRAVENOUS | Status: DC
  Administered 2015-01-23: 09:00:00 via INTRAVENOUS

## 2015-01-23 MED ORDER — MEPERIDINE HCL 100 MG/ML IJ SOLN
INTRAMUSCULAR | Status: AC
  Filled 2015-01-23: qty 2

## 2015-01-23 MED ORDER — SODIUM CHLORIDE 0.9 % IJ SOLN
INTRAMUSCULAR | Status: AC
  Filled 2015-01-23: qty 3

## 2015-01-23 NOTE — H&P (View-Only) (Signed)
Subjective:    Patient ID: [name], female    DOB: [birth year], 39 y.o.   MRN: [id number]  Evelina Dun, FNP  HPI SYMPTOMS STARTED 3 YRS AGO AFTER HER HYSTERECTOMY. Did well FROM MAY-AUG(1X/Q3-4 WEEKS). HAS PAIN IN ABDOMEN-SOMETIMES UPPER AND SOMETIMES LOWER. HAS HEARTBURN OFF AND ON. FEELS FINE ONE DAY AND THEN NEXT DAY DOESN'T FEEL GOOD. LOSES APPETITE BECAUSE ABD FEELS FULL. LOSES A FEW POUNDS. PROTONIX NOT IDEALLY CONTROLLING HER SYMPTOMS-KEPT AWAKE BY BURNING AND[AIN IN CHEST AND CAN FEEL IT IN NECK AND JAW AREA. MAY HAVE TO TAKE ALKA SELTZER, ROLAIDS, OR WHAT'S AVAILABLE. TRIGGERS: DOESN'T EAT OUT. OVER SUMMER ATE WHAT SHE WANTED. AVOID FRIED FOODS AND CAFFEINE. RARE CHOCOLATE. EATS COOKED ONIONS(1-2X/WEEK). NO GARLIC EVERY DAY. LAST NIGHT FOR DINNER: SQUASH WITH TOMATOES, GARLIC POWDER. MAY HAVE A STEADY ACHE IN [place]. FEELS WORSE WHEN SHE FEELS FULL NOT BLOATED. LINZESS HELPING HER HAVE MORE REGULAR BOWEL MOVEMENTS BUT NOT EVERY DAY. STRESS: HAS HUSBAND, SON, 2 STEP CHILDREN(1 EVERY OTHER WEEKEND). 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 WORRIES ABOUT HER SON (1ST GRADE) AND OTHER THINGS. HUSBAND STRESSES ABOUT HIS JOB/MONEY AND LEFT HIS WELL PAYING JOB. ALIGN DIDN'T CHANGE SX AFTER 2-3 MOS. HAS DAYS WHEN SHE FEELS LIKE SHE HAS TO URINATE ALL DAY LONG. MAY BURNING AND PAIN IN SUPRAPUBIC REGION. USUALLY HITS HER AT NIGHT. THOUGHT SHE HAD OVERACTIVE BLADDER. HEARTBURN: AT NIGHT OR BEDTIME, CAN BE AWAKENED IN MIDDLE OF NIGHT, SEVERAL DAYS NONE THEN 1 WEEK Q2-3 DAYS. HUSBAND FREAKS OUT IF HE DOESN'T HAVE A BM DAILY.  PT DENIES FEVER, CHILLS, HEMATOCHEZIA, nausea, vomiting, melena, diarrhea, SHORTNESS OF BREATH, CHANGE IN BOWEL IN HABITS, constipation,  problems swallowing, OR problems with sedation.  Past Medical History  Diagnosis Date  . [facility]  . History of chicken pox   . Anemia   . Yeast vaginitis   . Dysplasia of cervix,  low grade (CIN 1)   . Overactive bladder     MILD   Past Surgical History  Procedure Laterality Date  . Wisdom teethextraction    . Cervical conization w/bx  12/15/2010    Procedure: CONIZATION CERVIX WITH BIOPSY;  Surgeon: Delice Lesch, MD;  Location: [facility];  Service: Gynecology;  Laterality: N/A;  . Laparoscopic hysterectomy  11/30/2011    Procedure: HYSTERECTOMY TOTAL LAPAROSCOPIC;  Surgeon: Delice Lesch, MD;  Location: [facility];  Service: Gynecology;  Laterality: N/A;  with cysto   No Known Allergies  Current Outpatient Prescriptions  Medication Sig Dispense Refill  . Linaclotide (LINZESS) 290 MCG CAPS capsule Take 1 capsule (290 mcg total) by mouth daily.    . Multiple Vitamins-Calcium (ONE-A-DAY WOMENS FORMULA PO) Take 1 tablet by mouth daily.      Marland Kitchen oxybutynin (DITROPAN) 5 MG tablet TAKE 1 TABLET(S) TWO-THREE TIMES A DAY    . pantoprazole (PROTONIX) 40 MG tablet TAKE 1 TABLET (40 MG TOTAL) BY MOUTH DAILY.    .      Laurin Coder 5 MG tablet       Family History  Problem Relation Age of Onset  . Cancer Father     lung cancer, deceased  . Cancer Mother     brain cancer, deceased  . Heart disease Maternal Grandmother   . Heart disease Paternal Grandfather     CHF   . Emphysema Paternal Grandmother   . Diabetes Paternal Grandfather   . Diabetes Maternal Grandfather   .  Stroke Paternal Grandfather   . Leukemia Paternal Uncle   . Colon cancer Neg Hx    Social History  Substance Use Topics  . Smoking status: Never Smoker   . Smokeless tobacco: None  . Alcohol Use: 0.6 oz/week    1 Standard drinks or equivalent per week     Comment: rare etoh use  BROTHER LIVES IN [place], BUT THEY DON'T COMMUNICATE THAT MUCH. INHERITED HOUSE FROM PARENTS. THEY DIED 8 MOS APART.  Review of Systems PER HPI OTHERWISE ALL SYSTEMS ARE NEGATIVE.    Objective:   Physical Exam  Constitutional: She is oriented to person, place, and time. She appears well-developed and well-nourished. No  distress.  HENT:  Head: Normocephalic and atraumatic.  Mouth/Throat: Oropharynx is clear and moist. No oropharyngeal exudate.  Eyes: Pupils are equal, round, and reactive to light. No scleral icterus.  Neck: Normal range of motion. Neck supple.  Cardiovascular: Normal rate, regular rhythm and normal heart sounds.   Pulmonary/Chest: Effort normal and breath sounds normal. No respiratory distress.  Abdominal: Soft. Bowel sounds are normal. She exhibits no distension. There is no tenderness.  Musculoskeletal: She exhibits no edema.  Lymphadenopathy:    She has no cervical adenopathy.  Neurological: She is alert and oriented to person, place, and time.  Psychiatric: She has a normal mood and affect.  Vitals reviewed.     Assessment & Plan:

## 2015-01-23 NOTE — Op Note (Signed)
[facility] [address] [place], [address]   ENDOSCOPY PROCEDURE REPORT  PATIENT: [name], [name]  MR#: [id number] BIRTHDATE: [birth year] , 39  yrs. old GENDER: female  ENDOSCOPIST: Danie Binder, MD REFERRED YB:[id number] [place], [place]: 02/01/15 PROCEDURE:   EGD w/ biopsy  INDICATIONS:dyspepsia.   epigastric pain. MEDICATIONS: Promethazine (Phenergan) 12.5 mg IV, Versed 5 mg IV, and Demerol 50 mg IV TOPICAL ANESTHETIC:   Viscous Xylocaine ASA CLASS:  DESCRIPTION OF PROCEDURE:     Physical exam was performed.  Informed consent was obtained from the patient after explaining the benefits, risks, and alternatives to the procedure.  The patient was connected to the monitor and placed in the left lateral position.  Continuous oxygen was provided by nasal cannula and IV medicine administered through an indwelling cannula.  After administration of sedation, the patients esophagus was intubated and the EG-[id number] ([id number])  endoscope was advanced under direct visualization to the second portion of the duodenum.  The scope was removed slowly by carefully examining the color, texture, anatomy, and integrity of the mucosa on the way out.  The patient was recovered in endoscopy and discharged home in satisfactory condition.  Estimated blood loss is zero unless otherwise noted in this procedure report.     ESOPHAGUS: The mucosa of the esophagus appeared normal.   STOMACH: Mild non-erosive gastritis (inflammation) was found.  Multiple biopsies were performed using cold forceps.   DUODENUM: The duodenal mucosa showed no abnormalities in the bulb and 2nd part of the duodenum.  Cold forceps biopsies were taken in the bulb and second portion. COMPLICATIONS: There were no immediate complications.  ENDOSCOPIC IMPRESSION: 1.   ABDOMINAL PAIN DUE TO GERD, GASTRITIS, AND IBS-C  RECOMMENDATIONS: DRINK WATER TO KEEP YOUR URINE LIGHT YELLOW. STRICTLY FOLLOW A  LOW FAT DIET. AVOID FOOD THAT TRIGGERS REFLUX. PROTONIX 30 MINUTES PRIOR TO MEALS TWICE DAILY. CONTINUE LINZESS. AWAIT BIOPSY RESULTS. FOLLOW UP IN JAN 2017.  REPEAT EXAM:    ________________eSigned:  Danie Binder, MD 2015-02-01 11:03 AM   CPT CODES: ICD CODES:  The ICD and CPT codes recommended by this software are interpretations from the data that the clinical staff has captured with the software.  The verification of the translation of this report to the ICD and CPT codes and modifiers is the sole responsibility of the health care institution and practicing physician where this report was generated.  [employer] will not be held responsible for the validity of the ICD and CPT codes included on this report.  AMA assumes no liability for data contained or not contained herein. CPT is a Designer, television/film set of the [employer].

## 2015-01-23 NOTE — Discharge Instructions (Signed)
YOUR UPPER ABDOMINAL PAIN IS DUE TO gastritis, REFLUX, & IBS WITH CONSTIPATION. I biopsied your stomach AND DUODENUM.   DRINK WATER TO KEEP YOUR URINE LIGHT YELLOW.  STRICTLY FOLLOW A LOW FAT DIET. MEATS SHOULD BE BAKED, BROILED, OR BOILED. AVOID FRIED FOODS.SEE INFO BELOW.  AVOID FOOD THAT TRIGGERS REFLUX. SEE INFO BELOW.   CONTINUE PROTONIX. TAKE 30 MINUTES PRIOR TO MEALS TWICE DAILY.  CONTINUE LINZESS.  YOUR BIOPSY RESULTS WILL BE AVAILABLE IN MY CHART AFTER OCT 23 AND  MY OFFICE WILL CONTACT YOU IN 10-14 DAYS WITH YOUR RESULTS.   FOLLOW UP IN JAN 2017.    UPPER ENDOSCOPY AFTER CARE Read the instructions outlined below and refer to this sheet in the next week. These discharge instructions provide you with general information on caring for yourself after you leave the hospital. While your treatment has been planned according to the most current medical practices available, unavoidable complications occasionally occur. If you have any problems or questions after discharge, call DR. Sharnelle Cappelli, [phone].  ACTIVITY  You may resume your regular activity, but move at a slower pace for the next 24 hours.   Take frequent rest periods for the next 24 hours.   Walking will help get rid of the air and reduce the bloated feeling in your belly (abdomen).   No driving for 24 hours (because of the medicine (anesthesia) used during the test).   You may shower.   Do not sign any important legal documents or operate any machinery for 24 hours (because of the anesthesia used during the test).    NUTRITION  Drink plenty of fluids.   You may resume your normal diet as instructed by your doctor.   Begin with a light meal and progress to your normal diet. Heavy or fried foods are harder to digest and may make you feel sick to your stomach (nauseated).   Avoid alcoholic beverages for 24 hours or as instructed.    MEDICATIONS  You may resume your normal medications.   WHAT YOU CAN  EXPECT TODAY  Some feelings of bloating in the abdomen.   Passage of more gas than usual.    IF YOU HAD A BIOPSY TAKEN DURING THE UPPER ENDOSCOPY:  Eat a soft diet IF YOU HAVE NAUSEA, BLOATING, ABDOMINAL PAIN, OR VOMITING.    FINDING OUT THE RESULTS OF YOUR TEST Not all test results are available during your visit. DR. Oneida Alar WILL CALL YOU WITHIN 14 DAYS OF YOUR PROCEDUE WITH YOUR RESULTS. Do not assume everything is normal if you have not heard from DR. Khamron Gellert, CALL HER OFFICE AT [phone].  SEEK IMMEDIATE MEDICAL ATTENTION AND CALL THE OFFICE: [phone] IF:  You have more than a spotting of blood in your stool.   Your belly is swollen (abdominal distention).   You are nauseated or vomiting.   You have a temperature over 101F.   You have abdominal pain or discomfort that is severe or gets worse throughout the day.   Gastritis  Gastritis is an inflammation (the body's way of reacting to injury and/or infection) of the stomach. It is often caused by bacterial (germ) infections. It can also be caused BY ASPIRIN, BC/GOODY POWDER'S, (IBUPROFEN) MOTRIN, OR ALEVE (NAPROXEN), chemicals (including alcohol), SPICY FOODS, and medications. This illness may be associated with generalized malaise (feeling tired, not well), UPPER ABDOMINAL STOMACH cramps, and fever. One common bacterial cause of gastritis is an organism known as H. Pylori. This can be treated with antibiotics.   REFLUX  TREATMENT There are a number of medicines used to treat reflux including: Antacids.  ZANTAC Proton-pump inhibitors: PROTONIX  HOME CARE INSTRUCTIONS Eat 2-3 hours before going to bed.  Try to reach and maintain a healthy weight. LOSE 10-20 LBS Do not eat just a few very large meals. Instead, eat 4 TO 6 smaller meals throughout the day.  Try to identify foods and beverages that make your symptoms worse, and avoid these.  Avoid tight clothing.  Do not exercise right after eating.   Low-Fat  Diet BREADS, CEREALS, PASTA, RICE, DRIED PEAS, AND BEANS These products are high in carbohydrates and most are low in fat. Therefore, they can be increased in the diet as substitutes for fatty foods. They too, however, contain calories and should not be eaten in excess. Cereals can be eaten for snacks as well as for breakfast.  Include foods that contain fiber (fruits, vegetables, whole grains, and legumes). Research shows that fiber may lower blood cholesterol levels, especially the water-soluble fiber found in fruits, vegetables, oat products, and legumes. FRUITS AND VEGETABLES It is good to eat fruits and vegetables. Besides being sources of fiber, both are rich in vitamins and some minerals. They help you get the daily allowances of these nutrients. Fruits and vegetables can be used for snacks and desserts. MEATS Limit lean meat, chicken, [place], and fish to no more than 6 ounces per day. Beef, Pork, and Lamb Use lean cuts of beef, pork, and lamb. Lean cuts include:  Extra-lean ground beef.  Arm roast.  Sirloin tip.  Center-cut ham.  Round steak.  Loin chops.  Rump roast.  Tenderloin.  Trim all fat off the outside of meats before cooking. It is not necessary to severely decrease the intake of red meat, but lean choices should be made. Lean meat is rich in protein and contains a highly absorbable form of iron. Premenopausal women, in particular, should avoid reducing lean red meat because this could increase the risk for low red blood cells (iron-deficiency anemia).  Chicken and [place] These are good sources of protein. The fat of poultry can be reduced by removing the skin and underlying fat layers before cooking. Chicken and [place] can be substituted for lean red meat in the diet. Poultry should not be fried or covered with high-fat sauces. Fish and Shellfish Fish is a good source of protein. Shellfish contain cholesterol, but they usually are low in saturated fatty acids. The preparation  of fish is important. Like chicken and [place], they should not be fried or covered with high-fat sauces. EGGS Egg whites contain no fat or cholesterol. They can be eaten often. Try 1 to 2 egg whites instead of whole eggs in recipes or use egg substitutes that do not contain yolk.  MILK AND DAIRY PRODUCTS Use skim or 1% milk instead of 2% or whole milk. Decrease whole milk, natural, and processed cheeses. Use nonfat or low-fat (2%) cottage cheese or low-fat cheeses made from vegetable oils. Choose nonfat or low-fat (1 to 2%) yogurt. Experiment with evaporated skim milk in recipes that call for heavy cream. Substitute low-fat yogurt or low-fat cottage cheese for sour cream in dips and salad dressings. Have at least 2 servings of low-fat dairy products, such as 2 glasses of skim (or 1%) milk each day to help get your daily calcium intake.  FATS AND OILS Butterfat, lard, and beef fats are high in saturated fat and cholesterol. These should be avoided.Vegetable fats do not contain cholesterol. AVOID coconut oil, palm  oil, and palm kernel oil, WHICH are very high in saturated fats. These should be limited. These fats are often used in bakery goods, processed foods, popcorn, oils, and nondairy creamers. Vegetable shortenings and some peanut butters contain hydrogenated oils, which are also saturated fats. Read the labels on these foods and check for saturated vegetable oils.  Desirable liquid vegetable oils are corn oil, cottonseed oil, olive oil, canola oil, safflower oil, soybean oil, and sunflower oil. Peanut oil is not as good, but small amounts are acceptable. Buy a heart-healthy tub margarine that has no partially hydrogenated oils in the ingredients. AVOID Mayonnaise and salad dressings often are made from unsaturated fats.  OTHER EATING TIPS Snacks  Most sweets should be limited as snacks. They tend to be rich in calories and fats, and their caloric content outweighs their nutritional value. Some good  choices in snacks are graham crackers, melba toast, soda crackers, bagels (no egg), English muffins, fruits, and vegetables. These snacks are preferable to snack crackers, [place] fries, and chips. Popcorn should be air-popped or cooked in small amounts of liquid vegetable oil.  Desserts Eat fruit, low-fat yogurt, and fruit ices instead of pastries, cake, and cookies. Sherbet, angel food cake, gelatin dessert, frozen low-fat yogurt, or other frozen products that do not contain saturated fat (pure fruit juice bars, frozen ice pops) are also acceptable.   COOKING METHODS Choose those methods that use little or no fat. They include: Poaching.  Braising.  Steaming.  Grilling.  Baking.  Stir-frying.  Broiling.  Microwaving.  Foods can be cooked in a nonstick pan without added fat, or use a nonfat cooking spray in regular cookware. Limit fried foods and avoid frying in saturated fat. Add moisture to lean meats by using water, broth, cooking wines, and other nonfat or low-fat sauces along with the cooking methods mentioned above. Soups and stews should be chilled after cooking. The fat that forms on top after a few hours in the refrigerator should be skimmed off. When preparing meals, avoid using excess salt. Salt can contribute to raising blood pressure in some people.  EATING AWAY FROM HOME Order entres, potatoes, and vegetables without sauces or butter. When meat exceeds the size of a deck of cards (3 to 4 ounces), the rest can be taken home for another meal. Choose vegetable or fruit salads and ask for low-calorie salad dressings to be served on the side. Use dressings sparingly. Limit high-fat toppings, such as bacon, crumbled eggs, cheese, sunflower seeds, and olives. Ask for heart-healthy tub margarine instead of butter.  REFLUX SYMPTOMS Common symptoms of GERD are heartburn (burning in your chest). This is worse when lying down or bending over. It may also cause belching and indigestion.  Some of the things which make GERD worse are:  Increased weight pushes on stomach making acid rise more easily.   Smoking markedly increases acid production.   Alcohol decreases lower esophageal sphincter pressure (valve between stomach and esophagus), allowing acid from stomach into esophagus.   HOME CARE INSTRUCTIONS  Try to achieve and maintain an ideal body weight.   Avoid drinking alcoholic beverages.   DO NOT smokE.   Do not wear tight clothing around your chest or stomach.   Eat smaller meals and eat more frequently. This keeps your stomach from getting too full. Eat slowly.   Do not lie down for 2 or 3 hours after eating. Do not eat or drink anything 1 to 2 hours before going to bed.   Avoid  caffeine beverages (colas, coffee, cocoa, tea), fatty foods, citrus fruits and all other foods and drinks that contain acid and that seem to increase the problems.   Avoid bending over, especially after eating OR STRAINING. Anything that increases the pressure in your belly increases the amount of acid that may be pushed up into your esophagus.

## 2015-01-23 NOTE — Interval H&P Note (Signed)
History and Physical Interval Note:  01/23/2015 9:44 AM  [name]  has presented today for surgery, with the diagnosis of dyspepsia  The various methods of treatment have been discussed with the patient and family. After consideration of risks, benefits and other options for treatment, the patient has consented to  Procedure(s) with comments: ESOPHAGOGASTRODUODENOSCOPY (EGD) (N/A) - 1015  as a surgical intervention .  The patient's history has been reviewed, patient examined, no change in status, stable for surgery.  I have reviewed the patient's chart and labs.  Questions were answered to the patient's satisfaction.     [employer]

## 2015-01-28 ENCOUNTER — Encounter (HOSPITAL_COMMUNITY): Payer: Self-pay | Admitting: Gastroenterology

## 2015-02-02 ENCOUNTER — Telehealth: Payer: Self-pay | Admitting: Gastroenterology

## 2015-02-02 NOTE — Telephone Encounter (Signed)
Please call pt. HER stomach Bx shows gastritis.  HER SMALL BOWEL BIOPSIES ARE NORMAL. HER SYMPTOMS ARE MOST LIKELY DUE TO GERD.  DRINK WATER TO KEEP YOUR URINE LIGHT YELLOW. AVOID FOOD THAT TRIGGERS REFLUX. FOLLOW A LOW FAT DIET. MEATS SHOULD BE BAKED, BROILED, OR BOILED. CONTINUE PROTONIX. TAKE 30 MINUTES PRIOR TO MEALS ONCE OR TWICE DAILY. OPV JAN 2017 E30 GERD/ABDOMINAL PAIN.

## 2015-02-02 NOTE — Telephone Encounter (Signed)
PT HAS FU OV

## 2015-02-04 NOTE — Telephone Encounter (Signed)
PT is aware of results.  

## 2015-02-04 NOTE — Telephone Encounter (Signed)
LMOM to call.

## 2015-03-31 ENCOUNTER — Telehealth: Payer: Self-pay

## 2015-03-31 MED ORDER — OMEPRAZOLE 20 MG PO CPDR: 20.0000 mg | DELAYED_RELEASE_CAPSULE | Freq: Two times a day (BID) | ORAL | Status: DC

## 2015-03-31 NOTE — Telephone Encounter (Signed)
Pt called and said the Protonix bid is not working. She has been on it for several months. She was previously on Prilosec and it was working fine, she thought. She has been having a lot of indigestion so bad that she has to sometimes sit in recliner at night to get any sleep. Please advise!

## 2015-03-31 NOTE — Telephone Encounter (Signed)
Routing to [employer], NP in Dr. Oneida Alar absence.

## 2015-03-31 NOTE — Telephone Encounter (Signed)
That's fine. She can stop Protonix. I will sent in Prilosec (Omeprazole) 20 mg bid to her pharmacy.

## 2015-03-31 NOTE — Telephone Encounter (Signed)
Pt is aware.  

## 2015-04-14 ENCOUNTER — Telehealth: Payer: Self-pay | Admitting: Gastroenterology

## 2015-04-14 NOTE — Telephone Encounter (Signed)
REVIEWED. AGREE. NO ADDITIONAL RECOMMENDATIONS. 

## 2015-04-14 NOTE — Telephone Encounter (Signed)
I called pt and she is taking the Omeprazole 20 mg bid now and a lot of times has to take Rolaids or Alka Setzer. She said that she probably needs to watch her diet a little more. She cooks a lot with onions and garlic. She has appt with Neil Crouch, PA on Mon 04/20/2015 and is aware she should keep that appt.

## 2015-04-14 NOTE — Telephone Encounter (Signed)
[id number]   PATIENT DOES NOT THINK MEDICATION IS WORKING BECAUSE SHE IS HAVING TO TAKE ANTACIDS IN ADDITION TO HER MEDICATION.  IT IS WAKING HER UP AT NIGHT.  SHE HAS A APPT Monday BUT SHE IS UPSET AND I THINK SHE NEEDS TO SPEAK TO A NURSE ABOUT IF THERE IS ANYTHING ELSE SHE CAN DO UNTIL Monday TO ALEVIATE IT.

## 2015-04-15 MED ORDER — SUCRALFATE 1 GM/10ML PO SUSP: 1.0000 g | Freq: Three times a day (TID) | ORAL | Status: DC

## 2015-04-15 NOTE — Telephone Encounter (Signed)
Pt called back and is aware of the prescription.

## 2015-04-15 NOTE — Telephone Encounter (Signed)
LMOM for a return call.  

## 2015-04-15 NOTE — Addendum Note (Signed)
Addended by: Mahala Menghini on: 04/15/2015 09:02 AM   Modules accepted: Orders

## 2015-04-15 NOTE — Telephone Encounter (Signed)
Noted.   In interim, we could try adding short course of carafate if she would like. rx sent

## 2015-04-20 ENCOUNTER — Encounter: Payer: Self-pay | Admitting: Gastroenterology

## 2015-04-20 ENCOUNTER — Ambulatory Visit (INDEPENDENT_AMBULATORY_CARE_PROVIDER_SITE_OTHER): Payer: BLUE CROSS/BLUE SHIELD | Admitting: Gastroenterology

## 2015-04-20 VITALS — BP 95/64 | HR 67 | Temp 97.4°F | Ht 66.0 in | Wt 126.0 lb

## 2015-04-20 DIAGNOSIS — K219 Gastro-esophageal reflux disease without esophagitis: Secondary | ICD-10-CM | POA: Diagnosis not present

## 2015-04-20 NOTE — Patient Instructions (Signed)
1. Dexilant 60mg  once daily 30 minutes before breakfast. Samples provided.  2. Keep a journal of symptoms and associated foods.  3. Call in two weeks with a progress report.

## 2015-04-20 NOTE — Assessment & Plan Note (Signed)
Ongoing symptoms. She feels like she is "restricting" her diet adequately. Still cooking with some onions and garlic. Hard to eliminate altogether. Following antireflux measures otherwise. No abdominal pain. Just GERD now.   Recommend stopping omeprazole. Start Dexilant 60mg  daily before evening meal. Samples for two weeks provided. Keep journal of symptoms and associated foods. Call in two weeks to discuss with me.

## 2015-04-20 NOTE — Progress Notes (Signed)
Primary Care Physician: Evelina Dun, FNP  Primary Gastroenterologist:  Barney Drain, MD   Chief Complaint  Patient presents with  . Follow-up    HPI: [name] is a 40 y.o. female here for follow-up. In October she had an EGD for epigastric pain. Noted to have mild gastritis, no H pylori. Small bowel biopsies were normal. Symptoms felt to be related to GERD. Esophagus appeared normal. She has been on Protonix twice a day for several months. In December she was switched to omeprazole 20 mg twice a day. She has utilized Zantac twice a day, Tums in addition to PPI therapy. CT abdomen and pelvis back in May 2016 for chronic right upper quadrant pain was unremarkable.  Patient notes her symptoms began back in 2013 after her hysterectomy. She is frustrated with not knowing why her symptoms started. She does think that Ditropan may be contributing to GERD. Symptoms seemed to worsen after switching from Vesicare because insurance made her. Denies abdominal pain at this time. Doesn't feel like she needs Linzess. Taking stool softeners when needed instead. BM every other day to every few days. No melena, brbpr.   Was taking antacids on top of PPI and still didn't help. GERD comes in waves every few minutes and may last for several hours to 24 hours. May hit at 3am in the morning or several hours after meals. Avoiding caffeine. Rarely eats fried foods, citrus foods. [place]. Trying to avoid tomatoes, onions, garlic some but not elimnating.  Days she avoids high acidic foods she still gets GERD. Few days ago she cut back to omeprazole once daily in morning. Husband thinks she is taking too much medication and making symptoms worse.    Current Outpatient Prescriptions  Medication Sig Dispense Refill  . Linaclotide (LINZESS) 290 MCG CAPS capsule Take 1 capsule (290 mcg total) by mouth daily. 30 capsule 11  . Multiple Vitamins-Calcium (ONE-A-DAY WOMENS FORMULA PO) Take 1 tablet by mouth  daily.      Marland Kitchen omeprazole (PRILOSEC) 20 MG capsule Take 1 capsule (20 mg total) by mouth 2 (two) times daily before a meal. 60 capsule 2  . oxybutynin (DITROPAN) 5 MG tablet TAKE 1 TABLET(S) TWO-THREE TIMES A DAY  2  . sucralfate (CARAFATE) 1 GM/10ML suspension Take 10 mLs (1 g total) by mouth 4 (four) times daily -  with meals and at bedtime. As needed for heartburn, upper abdominal pain 420 mL 1   No current facility-administered medications for this visit.    Allergies as of 04/20/2015  . (No Known Allergies)    ROS:  General: Negative for anorexia, weight loss, fever, chills, fatigue, weakness. ENT: Negative for hoarseness, difficulty swallowing , nasal congestion. CV: Negative for chest pain, angina, palpitations, dyspnea on exertion, peripheral edema.  Respiratory: Negative for dyspnea at rest, dyspnea on exertion, cough, sputum, wheezing.  GI: See history of present illness. GU:  Negative for dysuria, hematuria, urinary incontinence, urinary frequency, nocturnal urination.  Endo: Negative for unusual weight change.    Physical Examination:   BP 95/64 mmHg  Pulse 67  Temp(Src) 97.4 F (36.3 C)  Ht 5\' 6"  (1.676 m)  Wt 126 lb (57.153 kg)  BMI 20.35 kg/m2  LMP 11/27/2011  General: Well-nourished, well-developed in no acute distress.  Eyes: No icterus. Mouth: Oropharyngeal mucosa moist and pink , no lesions erythema or exudate. Lungs: Clear to auscultation bilaterally.  Heart: Regular rate and rhythm, no murmurs rubs or gallops.  Abdomen: Bowel sounds  are normal, nontender, nondistended, no hepatosplenomegaly or masses, no abdominal bruits or hernia , no rebound or guarding.   Extremities: No lower extremity edema. No clubbing or deformities. Neuro: Alert and oriented x 4   Skin: Warm and dry, no jaundice.   Psych: Alert and cooperative, normal mood and affect.

## 2015-04-28 NOTE — Progress Notes (Signed)
CC'ED TO PCP 

## 2015-05-04 ENCOUNTER — Telehealth: Payer: Self-pay | Admitting: Gastroenterology

## 2015-05-04 NOTE — Telephone Encounter (Signed)
PATIENT CALLED WITH TWO WEEK UPDATE.  PATIENT STATES SHE IS DOING BETTER AND NO MORE PROBLEMS WITH HEARTBURN OR GERD.

## 2015-05-05 ENCOUNTER — Telehealth: Payer: Self-pay | Admitting: Gastroenterology

## 2015-05-05 MED ORDER — DEXLANSOPRAZOLE 60 MG PO CPDR: 60.0000 mg | DELAYED_RELEASE_CAPSULE | Freq: Every day | ORAL | Status: DC

## 2015-05-05 NOTE — Addendum Note (Signed)
Addended by: Mahala Menghini on: 05/05/2015 12:58 PM   Modules accepted: Orders

## 2015-05-05 NOTE — Telephone Encounter (Signed)
PATIENT STATED THAT DEXILANT WAS JUST TOO MUCH, WHAT ELSE CAN SHE TRY THAT WILL BE LESS OR HAVE A GENERIC VERSION    [phone]

## 2015-05-05 NOTE — Telephone Encounter (Signed)
PT called and left a Vm that she was to call Neil Crouch, PA in a couple of weeks and discuss the medication ( Dexilant).  Her return call phone number is [phone].  I called her and she said she stopped the Linzess and the medication for her bladder and she took the Dexilant at night. She has not had any more trouble with heartburn or gerd. She said that [place] had told her to call and she will talk with her. I told her that Magda Paganini is seeing pts but I will let her know so she can return the call.

## 2015-05-05 NOTE — Telephone Encounter (Signed)
I called pt and told her Magda Paganini has left for the day and I will send her a note.

## 2015-05-05 NOTE — Telephone Encounter (Signed)
Spoke with patient. Doing well on Dexilant. No longer on oxybutynin. Stopped it on her own.  I suggest she stay on Dexilant 1-2 months and then she can try to back down to every other day. Oxybutynin may have been causing her GERD to flare. Patient is resistent to taking medications.   RX for Dexilant sent. Advised patient to go online to find rebate card or we could mail one.

## 2015-05-06 MED ORDER — LANSOPRAZOLE 30 MG PO CPDR: 30.0000 mg | DELAYED_RELEASE_CAPSULE | Freq: Every day | ORAL | Status: DC

## 2015-05-06 NOTE — Telephone Encounter (Signed)
She has failed omeprazole and pantoprazole both BID. Can we find out what is on her formulary?

## 2015-05-06 NOTE — Telephone Encounter (Signed)
Routing to Julie to advise! 

## 2015-05-06 NOTE — Telephone Encounter (Signed)
Please let patient know I have sent in lansoprazole once daily.

## 2015-05-06 NOTE — Telephone Encounter (Signed)
LMOM that new Rx has been sent in.

## 2015-05-06 NOTE — Addendum Note (Signed)
Addended by: Mahala Menghini on: 05/06/2015 03:52 PM   Modules accepted: Orders, Medications

## 2015-05-06 NOTE — Telephone Encounter (Signed)
Per [employer] [place] formulary that I found online: dexilant is tier 4 aciphex is tier 4  rabeprazole is tier 2 nexium is tier 4 esomeprazole is tier 2 Prevacid is tier 4 lansoprazole is tier 2 protonix is tier 4 pantoprazole is tier 1 Omeprazole is tier 1  zegerid and generic zegerid are tier 4

## 2016-02-17 ENCOUNTER — Encounter: Payer: Self-pay | Admitting: Family Medicine

## 2016-02-17 ENCOUNTER — Ambulatory Visit (INDEPENDENT_AMBULATORY_CARE_PROVIDER_SITE_OTHER): Payer: BLUE CROSS/BLUE SHIELD | Admitting: Family Medicine

## 2016-02-17 VITALS — BP 104/70 | HR 68 | Temp 98.7°F | Ht 66.0 in | Wt 126.0 lb

## 2016-02-17 DIAGNOSIS — H00011 Hordeolum externum right upper eyelid: Secondary | ICD-10-CM

## 2016-02-17 MED ORDER — AMOXICILLIN 500 MG PO TABS: 500.0000 mg | ORAL_TABLET | Freq: Two times a day (BID) | ORAL | 0 refills | Status: DC

## 2016-02-17 NOTE — Progress Notes (Signed)
BP 104/70   Pulse 68   Temp 98.7 F (37.1 C) (Oral)   Ht 5\' 6"  (1.676 m)   Wt 126 lb (57.2 kg)   LMP 11/27/2011   BMI 20.34 kg/m    Subjective:    Patient ID: [name], female    DOB: 02-17-1976, 40 y.o.   MRN: [id number]  HPI: [name] is a 40 y.o. female presenting on 02/17/2016 for Swelling of right eye lid (x 2 days, feels itchy and irritated)   HPI Swelling of right eyelid Last night patient started developing pruritus and irritation in her right eyelid and then when she woke up this morning it was swollen over the upper right eyelid. She denies any drainage or matting or fevers or chills. She denies any nasal congestion or sinus congestion. She denies any issues with vision. She was concerned about whether it could be in contact.  Relevant past medical, surgical, family and social history reviewed and updated as indicated. Interim medical history since our last visit reviewed. Allergies and medications reviewed and updated.  Review of Systems  Constitutional: Negative for chills and fever.  HENT: Negative for congestion, ear discharge, ear pain, postnasal drip, rhinorrhea, sinus pain, sinus pressure and sore throat.   Eyes: Positive for itching. Negative for discharge, redness and visual disturbance.  Respiratory: Negative for cough, chest tightness and shortness of breath.   Cardiovascular: Negative for chest pain and leg swelling.  Genitourinary: Negative for difficulty urinating and dysuria.  Musculoskeletal: Negative for back pain and gait problem.  Skin: Negative for rash.  Neurological: Negative for light-headedness and headaches.  Psychiatric/Behavioral: Negative for agitation and behavioral problems.  All other systems reviewed and are negative.   Per HPI unless specifically indicated above     Medication List       Accurate as of 02/17/16  9:04 AM. Always use your most recent med list.          amoxicillin 500 MG tablet Commonly  known as:  AMOXIL Take 1 tablet (500 mg total) by mouth 2 (two) times daily.          Objective:    BP 104/70   Pulse 68   Temp 98.7 F (37.1 C) (Oral)   Ht 5\' 6"  (1.676 m)   Wt 126 lb (57.2 kg)   LMP 11/27/2011   BMI 20.34 kg/m   Wt Readings from Last 3 Encounters:  02/17/16 126 lb (57.2 kg)  04/20/15 126 lb (57.2 kg)  01/23/15 122 lb (55.3 kg)    Physical Exam  Constitutional: She is oriented to person, place, and time. She appears well-developed and well-nourished. No distress.  HENT:  Right Ear: External ear normal.  Left Ear: External ear normal.  Nose: Nose normal.  Mouth/Throat: Oropharynx is clear and moist. No oropharyngeal exudate.  Eyes: Conjunctivae are normal. Pupils are equal, round, and reactive to light. Right eye exhibits hordeolum (Small external hordeolum on right upper inner eyelid, no drainage, mild swelling and erythema). Right eye exhibits no chemosis, no discharge and no exudate. Left eye exhibits no discharge. Right conjunctiva is not injected. Right conjunctiva has no hemorrhage. Right eye exhibits normal extraocular motion and no nystagmus.  Cardiovascular: Normal rate, regular rhythm, normal heart sounds and intact distal pulses.   No murmur heard. Pulmonary/Chest: Effort normal and breath sounds normal. No respiratory distress. She has no wheezes. She has no rales.  Musculoskeletal: Normal range of motion. She exhibits no edema or tenderness.  Neurological:  She is alert and oriented to person, place, and time. Coordination normal.  Skin: Skin is warm and dry. No rash noted. She is not diaphoretic.  Psychiatric: She has a normal mood and affect. Her behavior is normal.  Nursing note and vitals reviewed.     Assessment & Plan:   Problem List Items Addressed This Visit    None    Visit Diagnoses    Hordeolum externum of right upper eyelid    -  Primary   Use warm compresses and if worsens pickup antibiotics       Follow up plan: Return if  symptoms worsen or fail to improve.  Counseling provided for all of the vaccine components No orders of the defined types were placed in this encounter.   Caryl Pina, MD [facility] 02/17/2016, 9:04 AM

## 2016-05-30 DIAGNOSIS — Z139 Encounter for screening, unspecified: Secondary | ICD-10-CM | POA: Diagnosis not present

## 2016-05-30 DIAGNOSIS — Z1272 Encounter for screening for malignant neoplasm of vagina: Secondary | ICD-10-CM | POA: Diagnosis not present

## 2016-05-30 DIAGNOSIS — R4586 Emotional lability: Secondary | ICD-10-CM | POA: Diagnosis not present

## 2016-05-30 DIAGNOSIS — D069 Carcinoma in situ of cervix, unspecified: Secondary | ICD-10-CM | POA: Diagnosis not present

## 2016-05-30 DIAGNOSIS — Z1231 Encounter for screening mammogram for malignant neoplasm of breast: Secondary | ICD-10-CM | POA: Diagnosis not present

## 2016-05-30 DIAGNOSIS — Z01419 Encounter for gynecological examination (general) (routine) without abnormal findings: Secondary | ICD-10-CM | POA: Diagnosis not present

## 2016-12-15 DIAGNOSIS — N898 Other specified noninflammatory disorders of vagina: Secondary | ICD-10-CM | POA: Diagnosis not present

## 2017-01-31 DIAGNOSIS — J029 Acute pharyngitis, unspecified: Secondary | ICD-10-CM | POA: Diagnosis not present

## 2017-02-11 DIAGNOSIS — S338XXA Sprain of other parts of lumbar spine and pelvis, initial encounter: Secondary | ICD-10-CM | POA: Diagnosis not present

## 2017-04-14 ENCOUNTER — Encounter: Payer: Self-pay | Admitting: Nurse Practitioner

## 2017-04-14 ENCOUNTER — Ambulatory Visit: Payer: BLUE CROSS/BLUE SHIELD | Admitting: Nurse Practitioner

## 2017-04-14 VITALS — BP 116/69 | HR 68 | Temp 97.8°F | Ht 66.0 in | Wt 127.0 lb

## 2017-04-14 DIAGNOSIS — L209 Atopic dermatitis, unspecified: Secondary | ICD-10-CM | POA: Diagnosis not present

## 2017-04-14 MED ORDER — TRIAMCINOLONE ACETONIDE 0.1 % EX CREA: 1.0000 "application " | TOPICAL_CREAM | Freq: Two times a day (BID) | CUTANEOUS | 0 refills | Status: DC

## 2017-04-14 NOTE — Progress Notes (Signed)
   Subjective:    Patient ID: [name], female    DOB: [birth year], 42 y.o.   MRN: [id number]  HPI Patient come sin today with a lesion on her face. Found it about 2 weeks ago.no change. No itching. Has used moisturizing cream with no change.    Review of Systems  Constitutional: Negative.   Respiratory: Negative.   Cardiovascular: Negative.   Neurological: Negative.   Psychiatric/Behavioral: Negative.   All other systems reviewed and are negative.      Objective:   Physical Exam  Constitutional: She is oriented to person, place, and time. She appears well-developed and well-nourished. No distress.  Cardiovascular: Normal rate and regular rhythm.  Pulmonary/Chest: Effort normal and breath sounds normal.  Neurological: She is alert and oriented to person, place, and time.  Skin: Skin is warm.  Patchy fine raised erythematous rash on right chin  Psychiatric: She has a normal mood and affect. Her behavior is normal. Judgment and thought content normal.   BP 116/69   Pulse 68   Temp 97.8 F (36.6 C) (Oral)   Ht 5\' 6"  (1.676 m)   Wt 127 lb (57.6 kg)   LMP 11/27/2011   BMI 20.50 kg/m        Assessment & Plan:   1. Atopic dermatitis, unspecified type    Meds ordered this encounter  Medications  . triamcinolone cream (KENALOG) 0.1 %    Sig: Apply 1 application topically 2 (two) times daily.    Dispense:  30 g    Refill:  0    Order Specific Question:   Supervising Provider    Answer:   Evette Doffing, CAROL L [[id number]]   cetaphil wash Avoid scratching or rubbing RTO if no better  Mary-Margaret Hassell Done, FNP

## 2017-05-31 ENCOUNTER — Encounter: Payer: Self-pay | Admitting: Family

## 2017-05-31 DIAGNOSIS — E559 Vitamin D deficiency, unspecified: Secondary | ICD-10-CM | POA: Diagnosis not present

## 2017-05-31 DIAGNOSIS — Z6821 Body mass index (BMI) 21.0-21.9, adult: Secondary | ICD-10-CM | POA: Diagnosis not present

## 2017-05-31 DIAGNOSIS — N871 Moderate cervical dysplasia: Secondary | ICD-10-CM | POA: Diagnosis not present

## 2017-05-31 DIAGNOSIS — Z1231 Encounter for screening mammogram for malignant neoplasm of breast: Secondary | ICD-10-CM | POA: Diagnosis not present

## 2017-05-31 DIAGNOSIS — D069 Carcinoma in situ of cervix, unspecified: Secondary | ICD-10-CM | POA: Diagnosis not present

## 2017-05-31 DIAGNOSIS — Z01419 Encounter for gynecological examination (general) (routine) without abnormal findings: Secondary | ICD-10-CM | POA: Diagnosis not present

## 2017-07-19 DIAGNOSIS — H5213 Myopia, bilateral: Secondary | ICD-10-CM | POA: Diagnosis not present

## 2017-08-04 DIAGNOSIS — Z139 Encounter for screening, unspecified: Secondary | ICD-10-CM | POA: Diagnosis not present

## 2017-09-28 DIAGNOSIS — L720 Epidermal cyst: Secondary | ICD-10-CM | POA: Diagnosis not present

## 2017-09-28 DIAGNOSIS — L710 Perioral dermatitis: Secondary | ICD-10-CM | POA: Diagnosis not present

## 2017-12-22 ENCOUNTER — Ambulatory Visit (INDEPENDENT_AMBULATORY_CARE_PROVIDER_SITE_OTHER): Payer: BLUE CROSS/BLUE SHIELD | Admitting: Family

## 2017-12-22 ENCOUNTER — Encounter: Payer: Self-pay | Admitting: Family

## 2017-12-22 VITALS — BP 111/74 | HR 75 | Temp 97.1°F | Ht 66.0 in | Wt 124.0 lb

## 2017-12-22 DIAGNOSIS — R002 Palpitations: Secondary | ICD-10-CM

## 2017-12-22 DIAGNOSIS — Z0000 Encounter for general adult medical examination without abnormal findings: Secondary | ICD-10-CM | POA: Diagnosis not present

## 2017-12-22 DIAGNOSIS — F411 Generalized anxiety disorder: Secondary | ICD-10-CM

## 2017-12-22 MED ORDER — ESCITALOPRAM OXALATE 10 MG PO TABS: 10.0000 mg | ORAL_TABLET | Freq: Every day | ORAL | 3 refills | Status: DC

## 2017-12-22 NOTE — Progress Notes (Signed)
Subjective:    Patient ID: [name], female    DOB: 1975-10-30, 42 y.o.   MRN: [id number]  Chief Complaint  Patient presents with  . Annual Exam  . Tachycardia    Patient states that she has been feeling like she has a rapid heart rate on and off x 6 years.  . Palpitations    Patient states that she has had this for years but it has gotten worse the last few weeks   Pt presents to the office today for CPE without pap.  Palpitations   This is a chronic problem. The current episode started more than 1 year ago. The problem occurs intermittently. The problem has been waxing and waning. The symptoms are aggravated by exercise. Associated symptoms include an irregular heartbeat and malaise/fatigue. Pertinent negatives include no coughing, diaphoresis, nausea or vomiting. She has tried nothing for the symptoms. The treatment provided no relief.  Anxiety  Presents for follow-up visit. Symptoms include palpitations. Patient reports no nausea.        Review of Systems  Constitutional: Positive for malaise/fatigue. Negative for diaphoresis.  Respiratory: Negative for cough.   Cardiovascular: Positive for palpitations.  Gastrointestinal: Negative for nausea and vomiting.  All other systems reviewed and are negative.      Objective:   Physical Exam  Constitutional: She is oriented to person, place, and time. She appears well-developed and well-nourished. No distress.  HENT:  Head: Normocephalic and atraumatic.  Right Ear: External ear normal.  Left Ear: External ear normal.  Mouth/Throat: Oropharynx is clear and moist.  Eyes: Pupils are equal, round, and reactive to light.  Neck: Normal range of motion. Neck supple. No thyromegaly present.  Cardiovascular: Normal rate, regular rhythm, normal heart sounds and intact distal pulses.  No murmur heard. Pulmonary/Chest: Effort normal and breath sounds normal. No respiratory distress. She has no wheezes.  Abdominal: Soft. Bowel  sounds are normal. She exhibits no distension. There is no tenderness.  Musculoskeletal: Normal range of motion. She exhibits no edema or tenderness.  Neurological: She is alert and oriented to person, place, and time. She has normal reflexes. No cranial nerve deficit.  Skin: Skin is warm and dry.  Psychiatric: Her behavior is normal. Judgment and thought content normal. Her mood appears anxious.  Vitals reviewed.     BP 111/74   Pulse 75   Temp (!) 97.1 F (36.2 C) (Oral)   Ht '5\' 6"'  (1.676 m)   Wt 124 lb (56.2 kg)   LMP 11/27/2011   BMI 20.01 kg/m      Assessment & Plan:  [name] comes in today with chief complaint of Annual Exam; Tachycardia (Patient states that she has been feeling like she has a rapid heart rate on and off x 6 years.); and Palpitations (Patient states that she has had this for years but it has gotten worse the last few weeks)   Diagnosis and orders addressed:  1. Annual physical exam - Anemia Profile B - CMP14+EGFR - Lipid panel - TSH - EKG 12-Lead  2. Palpitation - Anemia Profile B - CMP14+EGFR - EKG 12-Lead - EKG 12-Lead  3. GAD (generalized anxiety disorder) Will start Lexapro 10 mg  Stress management discussed  - escitalopram (LEXAPRO) 10 MG tablet; Take 1 tablet (10 mg total) by mouth daily.  Dispense: 90 tablet; Refill: 3   Labs pending Health Maintenance reviewed Diet and exercise encouraged  Follow up plan: 6 weeks    Evelina Dun, FNP

## 2017-12-22 NOTE — Patient Instructions (Addendum)
Palpitations A palpitation is the feeling that your heartbeat is irregular or is faster than normal. It may feel like your heart is fluttering or skipping a beat. Palpitations are usually not a serious problem. They may be caused by many things, including smoking, caffeine, alcohol, stress, and certain medicines. Although most causes of palpitations are not serious, palpitations can be a sign of a serious medical problem. In some cases, you may need further medical evaluation. Follow these instructions at home: Pay attention to any changes in your symptoms. Take these actions to help with your condition:  Avoid the following: ? Caffeinated coffee, tea, soft drinks, diet pills, and energy drinks. ? Chocolate. ? Alcohol.  Do not use any tobacco products, such as cigarettes, chewing tobacco, and e-cigarettes. If you need help quitting, ask your health care provider.  Try to reduce your stress and anxiety. Things that can help you relax include: ? Yoga. ? Meditation. ? Physical activity, such as swimming, jogging, or walking. ? Biofeedback. This is a method that helps you learn to use your mind to control things in your body, such as your heartbeats.  Get plenty of rest and sleep.  Take over-the-counter and prescription medicines only as told by your health care provider.  Keep all follow-up visits as told by your health care provider. This is important.  Contact a health care provider if:  You continue to have a fast or irregular heartbeat after 24 hours.  Your palpitations occur more often. Get help right away if:  You have chest pain or shortness of breath.  You have a severe headache.  You feel dizzy or you faint. This information is not intended to replace advice given to you by your health care provider. Make sure you discuss any questions you have with your health care provider. Document Released: 03/18/2000 Document Revised: 08/24/2015 Document Reviewed: 12/04/2014 Elsevier  Interactive Patient Education  2018 [place] After being diagnosed with an anxiety disorder, you may be relieved to know why you have felt or behaved a certain way. It is natural to also feel overwhelmed about the treatment ahead and what it will mean for your life. With care and support, you can manage this condition and recover from it. How to cope with anxiety Dealing with stress Stress is your body's reaction to life changes and events, both good and bad. Stress can last just a few hours or it can be ongoing. Stress can play a major role in anxiety, so it is important to learn both how to cope with stress and how to think about it differently. Talk with your health care provider or a counselor to learn more about stress reduction. He or she may suggest some stress reduction techniques, such as:  Music therapy. This can include creating or listening to music that you enjoy and that inspires you.  Mindfulness-based meditation. This involves being aware of your normal breaths, rather than trying to control your breathing. It can be done while sitting or walking.  Centering prayer. This is a kind of meditation that involves focusing on a word, phrase, or sacred image that is meaningful to you and that brings you peace.  Deep breathing. To do this, expand your stomach and inhale slowly through your nose. Hold your breath for 3-5 seconds. Then exhale slowly, allowing your stomach muscles to relax.  Self-talk. This is a skill where you identify thought patterns that lead to anxiety reactions and correct those thoughts.  Muscle  relaxation. This involves tensing muscles then relaxing them.  Choose a stress reduction technique that fits your lifestyle and personality. Stress reduction techniques take time and practice. Set aside 5-15 minutes a day to do them. Therapists can offer training in these techniques. The training may be covered by some insurance plans. Other  things you can do to manage stress include:  Keeping a stress diary. This can help you learn what triggers your stress and ways to control your response.  Thinking about how you respond to certain situations. You may not be able to control everything, but you can control your reaction.  Making time for activities that help you relax, and not feeling guilty about spending your time in this way.  Therapy combined with coping and stress-reduction skills provides the best chance for successful treatment. Medicines Medicines can help ease symptoms. Medicines for anxiety include:  Anti-anxiety drugs.  Antidepressants.  Beta-blockers.  Medicines may be used as the main treatment for anxiety disorder, along with therapy, or if other treatments are not working. Medicines should be prescribed by a health care provider. Relationships Relationships can play a big part in helping you recover. Try to spend more time connecting with trusted friends and family members. Consider going to couples counseling, taking family education classes, or going to family therapy. Therapy can help you and others better understand the condition. How to recognize changes in your condition Everyone has a different response to treatment for anxiety. Recovery from anxiety happens when symptoms decrease and stop interfering with your daily activities at home or work. This may mean that you will start to:  Have better concentration and focus.  Sleep better.  Be less irritable.  Have more energy.  Have improved memory.  It is important to recognize when your condition is getting worse. Contact your health care provider if your symptoms interfere with home or work and you do not feel like your condition is improving. Where to find help and support: You can get help and support from these sources:  Self-help groups.  Online and [employer].  A trusted spiritual leader.  Couples counseling.  Family  education classes.  Family therapy.  Follow these instructions at home:  Eat a healthy diet that includes plenty of vegetables, fruits, whole grains, low-fat dairy products, and lean protein. Do not eat a lot of foods that are high in solid fats, added sugars, or salt.  Exercise. Most adults should do the following: ? Exercise for at least 150 minutes each week. The exercise should increase your heart rate and make you sweat (moderate-intensity exercise). ? Strengthening exercises at least twice a week.  Cut down on caffeine, tobacco, alcohol, and other potentially harmful substances.  Get the right amount and quality of sleep. Most adults need 7-9 hours of sleep each night.  Make choices that simplify your life.  Take over-the-counter and prescription medicines only as told by your health care provider.  Avoid caffeine, alcohol, and certain over-the-counter cold medicines. These may make you feel worse. Ask your pharmacist which medicines to avoid.  Keep all follow-up visits as told by your health care provider. This is important. Questions to ask your health care provider  Would I benefit from therapy?  How often should I follow up with a health care provider?  How long do I need to take medicine?  Are there any long-term side effects of my medicine?  Are there any alternatives to taking medicine? Contact a health care provider if:  You  have a hard time staying focused or finishing daily tasks.  You spend many hours a day feeling worried about everyday life.  You become exhausted by worry.  You start to have headaches, feel tense, or have nausea.  You urinate more than normal.  You have diarrhea. Get help right away if:  You have a racing heart and shortness of breath.  You have thoughts of hurting yourself or others. If you ever feel like you may hurt yourself or others, or have thoughts about taking your own life, get help right away. You can go to your nearest  emergency department or call:  Your local emergency services (911 in the [place]  A suicide crisis helpline, such as the [facility] at (719)044-4132. This is open 24-hours a day.  Summary  Taking steps to deal with stress can help calm you.  Medicines cannot cure anxiety disorders, but they can help ease symptoms.  Family, friends, and partners can play a big part in helping you recover from an anxiety disorder. This information is not intended to replace advice given to you by your health care provider. Make sure you discuss any questions you have with your health care provider. Document Released: 03/15/2016 Document Revised: 03/15/2016 Document Reviewed: 03/15/2016 Elsevier Interactive Patient Education  [employer].

## 2017-12-23 LAB — ANEMIA PROFILE B
Basophils Absolute: 0 10*3/uL (ref 0.0–0.2)
Basos: 0 %
EOS (ABSOLUTE): 0 10*3/uL (ref 0.0–0.4)
EOS: 1 %
Ferritin: 129 ng/mL (ref 15–150)
Folate: 20 ng/mL (ref >3.0)
Hematocrit: 38.4 % (ref 34.0–46.6)
Hemoglobin: 12.8 g/dL (ref 11.1–15.9)
IMMATURE GRANULOCYTES: 0 %
IRON SATURATION: 30 % (ref 15–55)
Immature Grans (Abs): 0 10*3/uL (ref 0.0–0.1)
Iron: 83 ug/dL (ref 27–159)
LYMPHS ABS: 1.6 10*3/uL (ref 0.7–3.1)
Lymphs: 26 %
MCH: 30.5 pg (ref 26.6–33.0)
MCHC: 33.3 g/dL (ref 31.5–35.7)
MCV: 91 fL (ref 79–97)
Monocytes Absolute: 0.4 10*3/uL (ref 0.1–0.9)
Monocytes: 7 %
NEUTROS ABS: 3.9 10*3/uL (ref 1.4–7.0)
NEUTROS PCT: 66 %
Platelets: 207 10*3/uL (ref 150–450)
RBC: 4.2 x10E6/uL (ref 3.77–5.28)
RDW: 13.1 % (ref 12.3–15.4)
Retic Ct Pct: 1.5 % (ref 0.6–2.6)
Total Iron Binding Capacity: 278 ug/dL (ref 250–450)
UIBC: 195 ug/dL (ref 131–425)
Vitamin B-12: 375 pg/mL (ref 232–1245)
WBC: 6 10*3/uL (ref 3.4–10.8)

## 2017-12-23 LAB — LIPID PANEL
CHOL/HDL RATIO: 3.9 ratio (ref 0.0–4.4)
Cholesterol, Total: 214 mg/dL — ABNORMAL HIGH (ref 100–199)
HDL: 55 mg/dL (ref >39)
LDL CALC: 129 mg/dL — AB (ref 0–99)
TRIGLYCERIDES: 149 mg/dL (ref 0–149)
VLDL Cholesterol Cal: 30 mg/dL (ref 5–40)

## 2017-12-23 LAB — TSH: TSH: 5.29 u[IU]/mL — ABNORMAL HIGH (ref 0.450–4.500)

## 2017-12-23 LAB — CMP14+EGFR
ALT: 13 IU/L (ref 0–32)
AST: 16 IU/L (ref 0–40)
Albumin/Globulin Ratio: 1.7 (ref 1.2–2.2)
Albumin: 4.5 g/dL (ref 3.5–5.5)
Alkaline Phosphatase: 55 IU/L (ref 39–117)
BUN/Creatinine Ratio: 15 (ref 9–23)
BUN: 11 mg/dL (ref 6–24)
Bilirubin Total: 0.2 mg/dL (ref 0.0–1.2)
CALCIUM: 9.4 mg/dL (ref 8.7–10.2)
CO2: 21 mmol/L (ref 20–29)
CREATININE: 0.74 mg/dL (ref 0.57–1.00)
Chloride: 104 mmol/L (ref 96–106)
GFR calc Af Amer: 115 mL/min/{1.73_m2} (ref >59)
GFR, EST NON AFRICAN AMERICAN: 100 mL/min/{1.73_m2} (ref >59)
Globulin, Total: 2.6 g/dL (ref 1.5–4.5)
Glucose: 84 mg/dL (ref 65–99)
Potassium: 4 mmol/L (ref 3.5–5.2)
Sodium: 142 mmol/L (ref 134–144)
Total Protein: 7.1 g/dL (ref 6.0–8.5)

## 2017-12-25 ENCOUNTER — Other Ambulatory Visit: Payer: Self-pay | Admitting: Family

## 2017-12-25 DIAGNOSIS — E039 Hypothyroidism, unspecified: Secondary | ICD-10-CM

## 2017-12-25 MED ORDER — LEVOTHYROXINE SODIUM 50 MCG PO TABS: 50.0000 ug | ORAL_TABLET | Freq: Every day | ORAL | 11 refills | Status: DC

## 2017-12-26 DIAGNOSIS — Z23 Encounter for immunization: Secondary | ICD-10-CM | POA: Diagnosis not present

## 2018-01-16 ENCOUNTER — Telehealth: Payer: Self-pay | Admitting: Family

## 2018-01-16 NOTE — Telephone Encounter (Signed)
Since taking the levothyroxine her left eyelid started burning and then became irritated and scaly, the vaginal area also became red and irritated as well and today it is red and irritated as well. She states the symptoms come and go and it all started since starting the levothyroxine.

## 2018-01-18 NOTE — Telephone Encounter (Signed)
I have not had a patient with these symptoms before. Lets try to get TSH normal and see if rash resolves.

## 2018-01-18 NOTE — Telephone Encounter (Signed)
Spoke with patient and advised her that Alyse Low did not believe this was coming from her Levothyroxine and she would like her to monitor the area and continue the medication.  The patient reports that the area around the eye and also the redness and irritation in the vaginal area has improved.

## 2018-02-02 ENCOUNTER — Encounter: Payer: Self-pay | Admitting: Family

## 2018-02-02 ENCOUNTER — Ambulatory Visit: Payer: BLUE CROSS/BLUE SHIELD | Admitting: Family

## 2018-02-02 VITALS — BP 116/80 | HR 71 | Temp 96.9°F | Ht 66.0 in | Wt 125.6 lb

## 2018-02-02 DIAGNOSIS — E039 Hypothyroidism, unspecified: Secondary | ICD-10-CM | POA: Diagnosis not present

## 2018-02-02 DIAGNOSIS — N9089 Other specified noninflammatory disorders of vulva and perineum: Secondary | ICD-10-CM

## 2018-02-02 LAB — WET PREP FOR TRICH, YEAST, CLUE
CLUE CELL EXAM: NEGATIVE
Trichomonas Exam: NEGATIVE
Yeast Exam: NEGATIVE

## 2018-02-02 NOTE — Patient Instructions (Signed)
Hypothyroidism Hypothyroidism is a disorder of the thyroid. The thyroid is a large gland that is located in the lower front of the neck. The thyroid releases hormones that control how the body works. With hypothyroidism, the thyroid does not make enough of these hormones. What are the causes? Causes of hypothyroidism may include:  Viral infections.  Pregnancy.  Your own defense system (immune system) attacking your thyroid.  Certain medicines.  Birth defects.  Past radiation treatments to your head or neck.  Past treatment with radioactive iodine.  Past surgical removal of part or all of your thyroid.  Problems with the gland that is located in the center of your brain (pituitary).  What are the signs or symptoms? Signs and symptoms of hypothyroidism may include:  Feeling as though you have no energy (lethargy).  Inability to tolerate cold.  Weight gain that is not explained by a change in diet or exercise habits.  Dry skin.  Coarse hair.  Menstrual irregularity.  Slowing of thought processes.  Constipation.  Sadness or depression.  How is this diagnosed? Your health care provider may diagnose hypothyroidism with blood tests and ultrasound tests. How is this treated? Hypothyroidism is treated with medicine that replaces the hormones that your body does not make. After you begin treatment, it may take several weeks for symptoms to go away. Follow these instructions at home:  Take medicines only as directed by your health care provider.  If you start taking any new medicines, tell your health care provider.  Keep all follow-up visits as directed by your health care provider. This is important. As your condition improves, your dosage needs may change. You will need to have blood tests regularly so that your health care provider can watch your condition. Contact a health care provider if:  Your symptoms do not get better with treatment.  You are taking thyroid  replacement medicine and: ? You sweat excessively. ? You have tremors. ? You feel anxious. ? You lose weight rapidly. ? You cannot tolerate heat. ? You have emotional swings. ? You have diarrhea. ? You feel weak. Get help right away if:  You develop chest pain.  You develop an irregular heartbeat.  You develop a rapid heartbeat. This information is not intended to replace advice given to you by your health care provider. Make sure you discuss any questions you have with your health care provider. Document Released: 03/21/2005 Document Revised: 08/27/2015 Document Reviewed: 08/06/2013 Elsevier Interactive Patient Education  2018 [employer]  

## 2018-02-02 NOTE — Progress Notes (Signed)
   Subjective:    Patient ID: [name], female    DOB: 01/15/1976, 42 y.o.   MRN: [id number]  Chief Complaint  Patient presents with  . Hypothyroidism    six week recheck  . Anxiety   Pt presents to the office today to recheck GAD and hypothyroidism. PT was started on Lexapro, but states when she found that her thyroid was abnormal she never started the Lexapro. She has started levothyroxine and feels like her anxiety is improved, but still not gone. She states about one week after starting the levothyroxine she had her left eye lid and vaginal area became dry and erythemas. She states these symptoms have resolved.  Thyroid Problem  Presents for follow-up visit. Symptoms include anxiety, dry skin and palpitations. Patient reports no diaphoresis. The symptoms have been improving.  Vaginal Itching  The patient's primary symptoms include genital itching. The patient's pertinent negatives include no genital odor, vaginal bleeding or vaginal discharge.      Review of Systems  Constitutional: Negative for diaphoresis.  Cardiovascular: Positive for palpitations.  Genitourinary: Negative for vaginal discharge.  Psychiatric/Behavioral: The patient is nervous/anxious.   All other systems reviewed and are negative.      Objective:   Physical Exam  Constitutional: She is oriented to person, place, and time. She appears well-developed and well-nourished. No distress.  HENT:  Head: Normocephalic and atraumatic.  Right Ear: External ear normal.  Mouth/Throat: Oropharynx is clear and moist.  Eyes: Pupils are equal, round, and reactive to light.  Neck: Normal range of motion. Neck supple. No thyromegaly present.  Cardiovascular: Normal rate, regular rhythm, normal heart sounds and intact distal pulses.  No murmur heard. Pulmonary/Chest: Effort normal and breath sounds normal. No respiratory distress. She has no wheezes.  Abdominal: Soft. Bowel sounds are normal. She exhibits no  distension. There is no tenderness.  Genitourinary: Vagina normal. There is no rash (mild erythemas around labia) or tenderness on the right labia. There is no rash or tenderness on the left labia.  Musculoskeletal: Normal range of motion. She exhibits no edema or tenderness.  Neurological: She is alert and oriented to person, place, and time. She has normal reflexes. No cranial nerve deficit.  Skin: Skin is warm and dry.  Psychiatric: Her behavior is normal. Judgment and thought content normal. Her mood appears anxious.  Vitals reviewed.     BP 116/80   Pulse 71   Temp (!) 96.9 F (36.1 C) (Oral)   Ht 5\' 6"  (1.676 m)   Wt 125 lb 9.6 oz (57 kg)   LMP 11/27/2011   BMI 20.27 kg/m      Assessment & Plan:  [name] comes in today with chief complaint of Hypothyroidism (six week recheck) and Anxiety   Diagnosis and orders addressed:  1. Hypothyroidism, unspecified type Labs pending Continue Levothyroxine - TSH  2. Labia irritation Otc cream as needed - WET PREP FOR TRICH, YEAST, CLUE   Labs pending I do not believe her skin rash on eyes and labia are related to the Levothyroxine. They have resolved now. If anxiety continues after TSH stable, discussed trying to start Lexapro Stress management discussed RTO in 6 months if thyroid stable or sooner if anxiety worsens  Evelina Dun, FNP

## 2018-02-03 LAB — TSH: TSH: 1.21 u[IU]/mL (ref 0.450–4.500)

## 2018-04-09 ENCOUNTER — Telehealth: Payer: Self-pay | Admitting: Family

## 2018-04-09 MED ORDER — ESCITALOPRAM OXALATE 10 MG PO TABS: 10.0000 mg | ORAL_TABLET | Freq: Every day | ORAL | 3 refills | Status: DC

## 2018-04-09 NOTE — Telephone Encounter (Signed)
Ok to send lexapro 10?

## 2018-04-09 NOTE — Telephone Encounter (Signed)
Prescription sent to pharmacy.

## 2018-04-09 NOTE — Telephone Encounter (Signed)
Pt aware rx sent over. °

## 2018-05-18 ENCOUNTER — Telehealth: Payer: Self-pay | Admitting: Family

## 2018-05-18 NOTE — Telephone Encounter (Signed)
Patient has knot underneath arm that is sore to touch and red.  Appointment scheduled tomorrow at 8:15 am.

## 2018-05-19 ENCOUNTER — Encounter: Payer: Self-pay | Admitting: Family Medicine

## 2018-05-19 ENCOUNTER — Ambulatory Visit: Payer: BLUE CROSS/BLUE SHIELD | Admitting: Family Medicine

## 2018-05-19 VITALS — BP 112/75 | HR 63 | Temp 97.5°F | Ht 66.0 in | Wt 126.0 lb

## 2018-05-19 DIAGNOSIS — L732 Hidradenitis suppurativa: Secondary | ICD-10-CM | POA: Diagnosis not present

## 2018-05-19 MED ORDER — DOXYCYCLINE HYCLATE 100 MG PO CAPS: 100.0000 mg | ORAL_CAPSULE | Freq: Two times a day (BID) | ORAL | 0 refills | Status: DC

## 2018-05-19 NOTE — Progress Notes (Signed)
Chief Complaint  Patient presents with  . Arm Pain    knot in left axilla     HPI  Patient presents today for sore nodule onset 4 days ago in the left axilla.  The ball of the shoulder was sore for a day then the next day she reached under her arm felt the knot.  She has been using some hot compresses and that seems to be helping.  No fever chills or sweats.  No previous similar occurrence.  However, her husband has these frequently  PMH: Smoking status noted ROS: Per HPI  Objective: BP 112/75   Pulse 63   Temp (!) 97.5 F (36.4 C)   Ht 5\' 6"  (1.676 m)   Wt 126 lb (57.2 kg)   LMP 11/27/2011   BMI 20.34 kg/m  Gen: NAD, alert, cooperative with exam HEENT: NCAT, EOMI, PERRL  Ext: No edema, warm.  Left axilla has a 6 mm erythematous nodule.  No fluctuance. Neuro: Alert and oriented, No gross deficits  Assessment and plan:  1. Hydradenitis     Meds ordered this encounter  Medications  . doxycycline (VIBRAMYCIN) 100 MG capsule    Sig: Take 1 capsule (100 mg total) by mouth 2 (two) times daily.    Dispense:  20 capsule    Refill:  0    No orders of the defined types were placed in this encounter.   Follow up as needed.  Claretta Fraise, MD

## 2018-06-07 DIAGNOSIS — R309 Painful micturition, unspecified: Secondary | ICD-10-CM | POA: Diagnosis not present

## 2018-06-07 DIAGNOSIS — Z01419 Encounter for gynecological examination (general) (routine) without abnormal findings: Secondary | ICD-10-CM | POA: Diagnosis not present

## 2018-06-07 DIAGNOSIS — D069 Carcinoma in situ of cervix, unspecified: Secondary | ICD-10-CM | POA: Diagnosis not present

## 2018-06-07 DIAGNOSIS — Z139 Encounter for screening, unspecified: Secondary | ICD-10-CM | POA: Diagnosis not present

## 2018-06-07 DIAGNOSIS — E0590 Thyrotoxicosis, unspecified without thyrotoxic crisis or storm: Secondary | ICD-10-CM | POA: Diagnosis not present

## 2018-06-07 DIAGNOSIS — Z1231 Encounter for screening mammogram for malignant neoplasm of breast: Secondary | ICD-10-CM | POA: Diagnosis not present

## 2018-11-14 ENCOUNTER — Other Ambulatory Visit: Payer: Self-pay | Admitting: Family

## 2019-02-07 ENCOUNTER — Other Ambulatory Visit: Payer: Self-pay | Admitting: Family

## 2019-02-07 ENCOUNTER — Encounter: Payer: BLUE CROSS/BLUE SHIELD | Admitting: Family

## 2019-02-14 DIAGNOSIS — L02412 Cutaneous abscess of left axilla: Secondary | ICD-10-CM | POA: Diagnosis not present

## 2019-05-15 ENCOUNTER — Other Ambulatory Visit: Payer: Self-pay

## 2019-05-16 ENCOUNTER — Encounter: Payer: Self-pay | Admitting: Family

## 2019-05-16 ENCOUNTER — Encounter: Payer: BC Managed Care – PPO | Admitting: Family

## 2019-05-16 ENCOUNTER — Ambulatory Visit (INDEPENDENT_AMBULATORY_CARE_PROVIDER_SITE_OTHER): Payer: BC Managed Care – PPO | Admitting: Family

## 2019-05-16 VITALS — BP 119/74 | HR 68 | Temp 98.2°F | Ht 66.0 in | Wt 127.4 lb

## 2019-05-16 DIAGNOSIS — Z0000 Encounter for general adult medical examination without abnormal findings: Secondary | ICD-10-CM | POA: Diagnosis not present

## 2019-05-16 DIAGNOSIS — R232 Flushing: Secondary | ICD-10-CM

## 2019-05-16 DIAGNOSIS — F411 Generalized anxiety disorder: Secondary | ICD-10-CM

## 2019-05-16 DIAGNOSIS — Z8241 Family history of sudden cardiac death: Secondary | ICD-10-CM

## 2019-05-16 DIAGNOSIS — E039 Hypothyroidism, unspecified: Secondary | ICD-10-CM | POA: Diagnosis not present

## 2019-05-16 DIAGNOSIS — R002 Palpitations: Secondary | ICD-10-CM

## 2019-05-16 DIAGNOSIS — Z23 Encounter for immunization: Secondary | ICD-10-CM

## 2019-05-16 DIAGNOSIS — K219 Gastro-esophageal reflux disease without esophagitis: Secondary | ICD-10-CM

## 2019-05-16 MED ORDER — BUPROPION HCL ER (XL) 150 MG PO TB24: 150.0000 mg | ORAL_TABLET | Freq: Every day | ORAL | 1 refills | Status: DC

## 2019-05-16 NOTE — Patient Instructions (Signed)

## 2019-05-16 NOTE — Progress Notes (Signed)
Subjective:    Patient ID: [name], female    DOB: 02-07-1976, 44 y.o.   MRN: [id number]  Chief Complaint  Patient presents with  . Annual Exam   Pt presents to the office today for CPE without pap. She is followed by Gyn annually for pap and mammogram.   She reports she hasn't taken any medication over the last 3 months.   She reports her maternal grandmother died suddenly of a "heart attack" which makes her nervous. She reports she has intermittent palpitations which is is unsure if this is caused by anxiety or heart problems.  Anxiety Presents for follow-up visit. Symptoms include depressed mood, excessive worry, insomnia, irritability and restlessness. Symptoms occur occasionally. The severity of symptoms is moderate. The quality of sleep is good.    Thyroid Problem Presents for follow-up visit. Symptoms include depressed mood and fatigue. Patient reports no constipation or diarrhea. The symptoms have been worsening.  Gastroesophageal Reflux She complains of belching and heartburn. This is a chronic problem. The current episode started more than 1 year ago. The problem occurs rarely. Associated symptoms include fatigue. She has tried a diet change for the symptoms. The treatment provided moderate relief.      Review of Systems  Constitutional: Positive for fatigue and irritability.  Gastrointestinal: Positive for heartburn. Negative for constipation and diarrhea.  Psychiatric/Behavioral: The patient has insomnia.   All other systems reviewed and are negative.   Family History  Problem Relation Age of Onset  . Cancer Father        lung cancer, deceased  . Cancer Mother        brain cancer, deceased  . Heart disease Maternal Grandmother   . Heart disease Paternal Grandfather        CHF   . Diabetes Paternal Grandfather   . Stroke Paternal Grandfather   . Emphysema Paternal Grandmother   . Diabetes Maternal Grandfather   . Leukemia Paternal Uncle   . Colon  cancer Neg Hx    Social History   Socioeconomic History  . Marital status: Married    Spouse name: Not on file  . Number of children: 1  . Years of education: Not on file  . Highest education level: Not on file  Occupational History  . Occupation: unemployed  Tobacco Use  . Smoking status: Never Smoker  . Smokeless tobacco: Never Used  Substance and Sexual Activity  . Alcohol use: Yes    Alcohol/week: 1.0 standard drinks    Types: 1 Standard drinks or equivalent per week    Comment: rare etoh use  . Drug use: No  . Sexual activity: Yes    Partners: Male    Comment: Hysterectomy  Other Topics Concern  . Not on file  Social History Narrative  . Not on file   Social Determinants of Health   Financial Resource Strain:   . Difficulty of Paying Living Expenses: Not on file  Food Insecurity:   . Worried About Charity fundraiser in the Last Year: Not on file  . Ran Out of Food in the Last Year: Not on file  Transportation Needs:   . Lack of Transportation (Medical): Not on file  . Lack of Transportation (Non-Medical): Not on file  Physical Activity:   . Days of Exercise per Week: Not on file  . Minutes of Exercise per Session: Not on file  Stress:   . Feeling of Stress : Not on file  Social Connections:   .  Frequency of Communication with Friends and Family: Not on file  . Frequency of Social Gatherings with Friends and Family: Not on file  . Attends Religious Services: Not on file  . Active Member of Clubs or Organizations: Not on file  . Attends Archivist Meetings: Not on file  . Marital Status: Not on file       Objective:   Physical Exam Vitals reviewed.  Constitutional:      General: She is not in acute distress.    Appearance: She is well-developed.  HENT:     Head: Normocephalic and atraumatic.     Right Ear: Tympanic membrane normal.     Left Ear: Tympanic membrane normal.  Eyes:     Pupils: Pupils are equal, round, and reactive to light.    Neck:     Thyroid: No thyromegaly.  Cardiovascular:     Rate and Rhythm: Normal rate and regular rhythm.     Heart sounds: Normal heart sounds. No murmur.  Pulmonary:     Effort: Pulmonary effort is normal. No respiratory distress.     Breath sounds: Normal breath sounds. No wheezing.  Abdominal:     General: Bowel sounds are normal. There is no distension.     Palpations: Abdomen is soft.     Tenderness: There is no abdominal tenderness.  Musculoskeletal:        General: No tenderness. Normal range of motion.     Cervical back: Normal range of motion and neck supple.  Skin:    General: Skin is warm and dry.  Neurological:     Mental Status: She is alert and oriented to person, place, and time.     Cranial Nerves: No cranial nerve deficit.     Deep Tendon Reflexes: Reflexes are normal and symmetric.  Psychiatric:        Mood and Affect: Mood is anxious.        Behavior: Behavior normal.        Thought Content: Thought content normal.        Judgment: Judgment normal.       BP 119/74   Pulse 68   Temp 98.2 F (36.8 C) (Temporal)   Ht _0  (1.676 m)   Wt 127 lb 6.4 oz (57.8 kg)   LMP 11/27/2011   SpO2 100%   BMI 20.56 kg/m      Assessment & Plan:  [name] comes in today with chief complaint of Annual Exam   Diagnosis and orders addressed:  1. Annual physical exam - CMP14+EGFR - CBC with Differential/Platelet - Lipid panel - TSH  2. Gastroesophageal reflux disease, unspecified whether esophagitis present - CMP14+EGFR - CBC with Differential/Platelet  3. Hypothyroidism, unspecified type - CMP14+EGFR - CBC with Differential/Platelet - TSH  4. Hot flashes - FSH/LH  5. GAD (generalized anxiety disorder) Will try Wellbutrin since pt did not like Lexapro - FSH/LH - buPROPion (WELLBUTRIN XL) 150 MG 24 hr tablet; Take 1 tablet (150 mg total) by mouth daily.  Dispense: 90 tablet; Refill: 1 - Ambulatory referral to Cardiology  6. Palpitations I  believe are related to GAD, but will do referral to Cardiologists to rule out - Ambulatory referral to Cardiology  7. Family history of sudden cardiac death  - Ambulatory referral to Cardiology   Labs pending Health Maintenance reviewed- TDAP given today Diet and exercise encouraged  Follow up plan: 6 weeks to recheck GAD and depression   Evelina Dun, FNP

## 2019-05-17 LAB — CBC WITH DIFFERENTIAL/PLATELET
Basophils Absolute: 0 10*3/uL (ref 0.0–0.2)
Basos: 1 %
EOS (ABSOLUTE): 0 10*3/uL (ref 0.0–0.4)
Eos: 0 %
Hematocrit: 43.2 % (ref 34.0–46.6)
Hemoglobin: 14.1 g/dL (ref 11.1–15.9)
Immature Grans (Abs): 0 10*3/uL (ref 0.0–0.1)
Immature Granulocytes: 1 %
Lymphocytes Absolute: 1.7 10*3/uL (ref 0.7–3.1)
Lymphs: 27 %
MCH: 31.1 pg (ref 26.6–33.0)
MCHC: 32.6 g/dL (ref 31.5–35.7)
MCV: 95 fL (ref 79–97)
Monocytes Absolute: 0.5 10*3/uL (ref 0.1–0.9)
Monocytes: 7 %
Neutrophils Absolute: 4.2 10*3/uL (ref 1.4–7.0)
Neutrophils: 64 %
Platelets: 207 10*3/uL (ref 150–450)
RBC: 4.54 x10E6/uL (ref 3.77–5.28)
RDW: 12.8 % (ref 11.7–15.4)
WBC: 6.5 10*3/uL (ref 3.4–10.8)

## 2019-05-17 LAB — LIPID PANEL
Chol/HDL Ratio: 3.5 ratio (ref 0.0–4.4)
Cholesterol, Total: 202 mg/dL — ABNORMAL HIGH (ref 100–199)
HDL: 57 mg/dL (ref >39)
LDL Chol Calc (NIH): 128 mg/dL — ABNORMAL HIGH (ref 0–99)
Triglycerides: 94 mg/dL (ref 0–149)
VLDL Cholesterol Cal: 17 mg/dL (ref 5–40)

## 2019-05-17 LAB — CMP14+EGFR
ALT: 8 IU/L (ref 0–32)
AST: 19 IU/L (ref 0–40)
Albumin/Globulin Ratio: 2.1 (ref 1.2–2.2)
Albumin: 4.6 g/dL (ref 3.8–4.8)
Alkaline Phosphatase: 49 IU/L (ref 39–117)
BUN/Creatinine Ratio: 13 (ref 9–23)
BUN: 10 mg/dL (ref 6–24)
Bilirubin Total: 0.4 mg/dL (ref 0.0–1.2)
CO2: 21 mmol/L (ref 20–29)
Calcium: 9.5 mg/dL (ref 8.7–10.2)
Chloride: 104 mmol/L (ref 96–106)
Creatinine, Ser: 0.77 mg/dL (ref 0.57–1.00)
GFR calc Af Amer: 109 mL/min/{1.73_m2} (ref >59)
GFR calc non Af Amer: 94 mL/min/{1.73_m2} (ref >59)
Globulin, Total: 2.2 g/dL (ref 1.5–4.5)
Glucose: 87 mg/dL (ref 65–99)
Potassium: 4.1 mmol/L (ref 3.5–5.2)
Sodium: 138 mmol/L (ref 134–144)
Total Protein: 6.8 g/dL (ref 6.0–8.5)

## 2019-05-17 LAB — FSH/LH
FSH: 5.2 m[IU]/mL
LH: 2.2 m[IU]/mL

## 2019-05-17 LAB — TSH: TSH: 4.12 u[IU]/mL (ref 0.450–4.500)

## 2019-05-20 ENCOUNTER — Other Ambulatory Visit: Payer: Self-pay | Admitting: Family

## 2019-05-31 ENCOUNTER — Ambulatory Visit (INDEPENDENT_AMBULATORY_CARE_PROVIDER_SITE_OTHER): Payer: BC Managed Care – PPO | Admitting: Cardiovascular Disease

## 2019-05-31 ENCOUNTER — Telehealth: Payer: Self-pay | Admitting: Radiology

## 2019-05-31 ENCOUNTER — Other Ambulatory Visit: Payer: Self-pay

## 2019-05-31 ENCOUNTER — Encounter: Payer: Self-pay | Admitting: Cardiovascular Disease

## 2019-05-31 VITALS — BP 120/80 | HR 75 | Ht 66.0 in | Wt 129.0 lb

## 2019-05-31 DIAGNOSIS — R002 Palpitations: Secondary | ICD-10-CM

## 2019-05-31 NOTE — Progress Notes (Signed)
Cardiology Office Note:   Date:  05/31/2019  NAME:  [name]    MRN: [id number] DOB:  1976-01-30   PCP:  Sharion Balloon, FNP  Cardiologist:  No primary care provider on file.   Referring MD: Sharion Balloon, FNP   Chief Complaint  Patient presents with  . New Patient (Initial Visit)  . Chest Pain    Discomfort.  . Headache    Standing.   History of Present Illness:   [name] is a 44 y.o. female with a hx of anxiety, GERD who is being seen today for the evaluation of palpitations at the request of Sharion Balloon, FNP.  She reports episodes of palpitations for 1-1/2 years.  She reports she gets a daily episode of fluttering and pounding in her chest.  It can last seconds.  No identifiable trigger and no alleviating factors reported.  She reports she has had anxiety and depression for years.  She apparently had pretty bad episodes of palpitations 6 years ago after the death of her parents.  That resolved.  She recently started on Wellbutrin by her primary care physician.  She is only been on this for 2 weeks.  She does report some lightheadedness and dizziness with standing.  The palpitations can also occur at that time as well.  It appears that her symptoms can occur with exercise or when standing still.  She also reports that she goes to the gym quite frequently.  She exercises for up to 30 minutes/day 3-4 times per week.  She has no limitations such as chest pain or shortness of breath completed his level of activity.  She has no real cardiovascular disease risk factors.  Her EKG is nonspecific ST-T changes.  Blood pressure is well controlled.  She has noticeably anxious at the time of my examination.  Most recent thyroid studies from primary care office normal.  Labs from PCP showed creatinine 0.77, total cholesterol 202, HDL 57, triglycerides 94, LDL 128, hemoglobin 14.1, TSH 4.1  Past Medical History: Past Medical History:  Diagnosis Date  . Anemia   . [facility]  . Dysplasia of cervix, low grade (CIN 1)   . History of chicken pox   . Overactive bladder    MILD  . Yeast vaginitis     Past Surgical History: Past Surgical History:  Procedure Laterality Date  . CERVICAL CONIZATION W/BX  12/15/2010   Procedure: CONIZATION CERVIX WITH BIOPSY;  Surgeon: Delice Lesch, MD;  Location: [facility];  Service: Gynecology;  Laterality: N/A;  . ESOPHAGOGASTRODUODENOSCOPY N/A 01/23/2015   SLF: abdominal pain due to GERD, gastririts, and IBS-C  . LAPAROSCOPIC HYSTERECTOMY  11/30/2011   Procedure: HYSTERECTOMY TOTAL LAPAROSCOPIC;  Surgeon: Delice Lesch, MD;  Location: [facility];  Service: Gynecology;  Laterality: N/A;  with cysto  . wisdom teethextraction      Current Medications: Current Meds  Medication Sig  . buPROPion (WELLBUTRIN XL) 150 MG 24 hr tablet Take 1 tablet (150 mg total) by mouth daily.  . Multiple Vitamin (MULTIVITAMIN) capsule Take 1 capsule by mouth daily.     Allergies:    Patient has no known allergies.   Social History: Social History   Socioeconomic History  . Marital status: Married    Spouse name: Not on file  . Number of children: 1  . Years of education: Not on file  . Highest education level: Not on file  Occupational History  .  Occupation: stay at home mom  Tobacco Use  . Smoking status: Never Smoker  . Smokeless tobacco: Never Used  Substance and Sexual Activity  . Alcohol use: Yes    Alcohol/week: 1.0 standard drinks    Types: 1 Standard drinks or equivalent per week    Comment: rare etoh use  . Drug use: No  . Sexual activity: Yes    Partners: Male    Comment: Hysterectomy  Other Topics Concern  . Not on file  Social History Narrative  . Not on file   Social Determinants of Health   Financial Resource Strain:   . Difficulty of Paying Living Expenses: Not on file  Food Insecurity:   . Worried About Charity fundraiser in the Last Year: Not on file  .  Ran Out of Food in the Last Year: Not on file  Transportation Needs:   . Lack of Transportation (Medical): Not on file  . Lack of Transportation (Non-Medical): Not on file  Physical Activity:   . Days of Exercise per Week: Not on file  . Minutes of Exercise per Session: Not on file  Stress:   . Feeling of Stress : Not on file  Social Connections:   . Frequency of Communication with Friends and Family: Not on file  . Frequency of Social Gatherings with Friends and Family: Not on file  . Attends Religious Services: Not on file  . Active Member of Clubs or Organizations: Not on file  . Attends Archivist Meetings: Not on file  . Marital Status: Not on file     Family History: The patient's family history includes Cancer in her father and mother; Diabetes in her maternal grandfather and paternal grandfather; Emphysema in her paternal grandmother; Heart disease in her maternal grandmother and paternal grandfather; Leukemia in her paternal uncle; Stroke in her paternal grandfather. There is no history of Colon cancer.  ROS:   All other ROS reviewed and negative. Pertinent positives noted in the HPI.     EKGs/Labs/Other Studies Reviewed:   The following studies were personally reviewed by me today:  EKG:  EKG is ordered today.  The ekg ordered today demonstrates normal sinus rhythm, heart rate 75, nonspecific ST-T changes, no evidence of prior infarction, and was personally reviewed by me.   Recent Labs: 05/16/2019: ALT 8; BUN 10; Creatinine, Ser 0.77; Hemoglobin 14.1; Platelets 207; Potassium 4.1; Sodium 138; TSH 4.120   Recent Lipid Panel    Component Value Date/Time   CHOL 202 (H) 05/16/2019 1123   TRIG 94 05/16/2019 1123   HDL 57 05/16/2019 1123   CHOLHDL 3.5 05/16/2019 1123   LDLCALC 128 (H) 05/16/2019 1123    Physical Exam:   VS:  BP 120/80 (BP Location: Left Arm, Patient Position: Sitting, Cuff Size: Normal)   Pulse 75   Ht 5\' 6"  (1.676 m)   Wt 129 lb (58.5 kg)    LMP 11/27/2011   BMI 20.82 kg/m    Wt Readings from Last 3 Encounters:  05/31/19 129 lb (58.5 kg)  05/16/19 127 lb 6.4 oz (57.8 kg)  05/19/18 126 lb (57.2 kg)    General: Well nourished, well developed, in no acute distress Heart: Atraumatic, normal size  Eyes: PEERLA, EOMI  Neck: Supple, no JVD Endocrine: No thryomegaly Cardiac: Normal S1, S2; RRR; no murmurs, rubs, or gallops Lungs: Clear to auscultation bilaterally, no wheezing, rhonchi or rales  Abd: Soft, nontender, no hepatomegaly  Ext: No edema, pulses 2+ Musculoskeletal: No deformities,  BUE and BLE strength normal and equal Skin: Warm and dry, no rashes   Neuro: Alert and oriented to person, place, time, and situation, CNII-XII grossly intact, no focal deficits  Psych: Normal mood and affect   ASSESSMENT:   [name] is a 44 y.o. female who presents for the following: 1. Palpitations    PLAN:   1. Palpitations -Daily episodes of palpitations.  Appear to last seconds.  There is some component of position change with it.  She reports a heavy history of anxiety.  I do suspect this is likely anxiety related.  We will plan to proceed with a echocardiogram as well as 7-day ZIO patch to exclude any significant arrhythmia.  I hear no cardiac murmurs on examination and suspect her echo will be normal.  I think this will further reassure her that her heart is normal.  Recent thyroid studies were normal at her primary care physician office.  She is not anemic either.  We will plan to follow-up the results of her testing in 3 months by virtual visit.  Disposition: Return in about 3 months (around 08/28/2019), or if symptoms worsen or fail to improve.  Medication Adjustments/Labs and Tests Ordered: Current medicines are reviewed at length with the patient today.  Concerns regarding medicines are outlined above.  Orders Placed This Encounter  Procedures  . LONG TERM MONITOR (3-14 DAYS)  . EKG 12-Lead  . ECHOCARDIOGRAM COMPLETE    No orders of the defined types were placed in this encounter.   Patient Instructions  Medication Instructions:  The current medical regimen is effective;  continue present plan and medications.  *If you need a refill on your cardiac medications before your next appointment, please call your pharmacy*   Testing/Procedures: Echocardiogram - Your physician has requested that you have an echocardiogram. Echocardiography is a painless test that uses sound waves to create images of your heart. It provides your doctor with information about the size and shape of your heart and how well your heart's chambers and valves are working. This procedure takes approximately one hour. There are no restrictions for this procedure. This will be performed at our [facility] location - [address].  Your physician has recommended that you wear a 7 DAY ZIO-PATCH monitor. The Zio patch cardiac monitor continuously records heart rhythm data for up to 14 days, this is for patients being evaluated for multiple types heart rhythms. For the first 24 hours post application, please avoid getting the Zio monitor wet in the shower or by excessive sweating during exercise. After that, feel free to carry on with regular activities. Keep soaps and lotions away from the ZIO XT Patch.  This will be mailed to you, please expect 7-10 days to receive.           Follow-Up: At [facility], you and your health needs are our priority.  As part of our continuing mission to provide you with exceptional heart care, we have created designated Provider Care Teams.  These Care Teams include your primary Cardiologist (physician) and Advanced Practice Providers (APPs -  Physician Assistants and Nurse Practitioners) who all work together to provide you with the care you need, when you need it.  We recommend signing up for the patient portal called "MyChart".  Sign up information is provided on this After Visit Summary.   MyChart is used to connect with patients for Virtual Visits (Telemedicine).  Patients are able to view lab/test results, encounter notes,  upcoming appointments, etc.  Non-urgent messages can be sent to your provider as well.   To learn more about what you can do with MyChart, go to [email or web address].    Your next appointment:   3 month(s)  The format for your next appointment:   Virtual Visit   Provider:   Eleonore Chiquito, MD        Signed, Addison Naegeli. Audie Box, [place]  [address] [place], [place] [address] [phone]  05/31/2019 2:17 PM

## 2019-05-31 NOTE — Patient Instructions (Signed)
Medication Instructions:  The current medical regimen is effective;  continue present plan and medications.  *If you need a refill on your cardiac medications before your next appointment, please call your pharmacy*   Testing/Procedures: Echocardiogram - Your physician has requested that you have an echocardiogram. Echocardiography is a painless test that uses sound waves to create images of your heart. It provides your doctor with information about the size and shape of your heart and how well your heart's chambers and valves are working. This procedure takes approximately one hour. There are no restrictions for this procedure. This will be performed at our [facility] location - [address], [address].  Your physician has recommended that you wear a 7 DAY ZIO-PATCH monitor. The Zio patch cardiac monitor continuously records heart rhythm data for up to 14 days, this is for patients being evaluated for multiple types heart rhythms. For the first 24 hours post application, please avoid getting the Zio monitor wet in the shower or by excessive sweating during exercise. After that, feel free to carry on with regular activities. Keep soaps and lotions away from the ZIO XT Patch.  This will be mailed to you, please expect 7-10 days to receive.           Follow-Up: At [facility], you and your health needs are our priority.  As part of our continuing mission to provide you with exceptional heart care, we have created designated Provider Care Teams.  These Care Teams include your primary Cardiologist (physician) and Advanced Practice Providers (APPs -  Physician Assistants and Nurse Practitioners) who all work together to provide you with the care you need, when you need it.  We recommend signing up for the patient portal called "MyChart".  Sign up information is provided on this After Visit Summary.  MyChart is used to connect with patients for Virtual Visits (Telemedicine).  Patients are able to  view lab/test results, encounter notes, upcoming appointments, etc.  Non-urgent messages can be sent to your provider as well.   To learn more about what you can do with MyChart, go to [email or web address].    Your next appointment:   3 month(s)  The format for your next appointment:   Virtual Visit   Provider:   Eleonore Chiquito, MD

## 2019-05-31 NOTE — Telephone Encounter (Signed)
Enrolled patient for a 7 day Zio monitor to be mailed to patients home.  

## 2019-06-05 ENCOUNTER — Other Ambulatory Visit (INDEPENDENT_AMBULATORY_CARE_PROVIDER_SITE_OTHER): Payer: BC Managed Care – PPO

## 2019-06-05 DIAGNOSIS — R002 Palpitations: Secondary | ICD-10-CM

## 2019-06-18 DIAGNOSIS — Z1231 Encounter for screening mammogram for malignant neoplasm of breast: Secondary | ICD-10-CM | POA: Diagnosis not present

## 2019-06-18 DIAGNOSIS — Z1239 Encounter for other screening for malignant neoplasm of breast: Secondary | ICD-10-CM | POA: Diagnosis not present

## 2019-06-18 DIAGNOSIS — Z01419 Encounter for gynecological examination (general) (routine) without abnormal findings: Secondary | ICD-10-CM | POA: Diagnosis not present

## 2019-06-18 DIAGNOSIS — D069 Carcinoma in situ of cervix, unspecified: Secondary | ICD-10-CM | POA: Diagnosis not present

## 2019-06-18 DIAGNOSIS — R002 Palpitations: Secondary | ICD-10-CM | POA: Diagnosis not present

## 2019-06-26 ENCOUNTER — Other Ambulatory Visit (HOSPITAL_COMMUNITY): Payer: BC Managed Care – PPO

## 2019-06-28 ENCOUNTER — Ambulatory Visit (INDEPENDENT_AMBULATORY_CARE_PROVIDER_SITE_OTHER): Payer: BC Managed Care – PPO | Admitting: Family

## 2019-06-28 ENCOUNTER — Encounter: Payer: Self-pay | Admitting: Family

## 2019-06-28 DIAGNOSIS — F411 Generalized anxiety disorder: Secondary | ICD-10-CM | POA: Diagnosis not present

## 2019-06-28 DIAGNOSIS — R002 Palpitations: Secondary | ICD-10-CM | POA: Diagnosis not present

## 2019-06-28 MED ORDER — BUSPIRONE HCL 5 MG PO TABS: 5.0000 mg | ORAL_TABLET | Freq: Three times a day (TID) | ORAL | 3 refills | Status: DC

## 2019-06-28 MED ORDER — BUPROPION HCL ER (XL) 300 MG PO TB24: 300.0000 mg | ORAL_TABLET | Freq: Every day | ORAL | 3 refills | Status: DC

## 2019-06-28 NOTE — Progress Notes (Signed)
   Virtual Visit via telephone Note Due to COVID-19 pandemic this visit was conducted virtually. This visit type was conducted due to national recommendations for restrictions regarding the COVID-19 Pandemic (e.g. social distancing, sheltering in place) in an effort to limit this patient's exposure and mitigate transmission in our community. All issues noted in this document were discussed and addressed.  A physical exam was not performed with this format.  I connected with [name] on 06/28/19 at 9:08 AM  by telephone and verified that I am speaking with the correct person using two identifiers. [name] is currently located at home and no one is currently with her during visit. The provider, Evelina Dun, FNP is located in their office at time of visit.  I discussed the limitations, risks, security and privacy concerns of performing an evaluation and management service by telephone and the availability of in person appointments. I also discussed with the patient that there may be a patient responsible charge related to this service. The patient expressed understanding and agreed to proceed.   History and Present Illness:  Pt calls the office today to recheck GAD. She was started on Wellbutrin 150 mg. She reports she is still having palpitation and is anxious.    She saw the Cardiologists in 05/31/19 and had a negative holter monitor. She has an ECHO pending.  Anxiety Presents for follow-up visit. Symptoms include depressed mood, excessive worry, irritability, malaise, nervous/anxious behavior and restlessness. Symptoms occur most days. The severity of symptoms is moderate. The quality of sleep is good.        Review of Systems  Constitutional: Positive for irritability.  Psychiatric/Behavioral: The patient is nervous/anxious.   All other systems reviewed and are negative.    Observations/Objective: No SOB or distress noted   Assessment and Plan: 1. GAD (generalized  anxiety disorder) Will increase Wellbutrin to 300 mg from 150 mg  Will add Buspar 5 mg TID prn Stress management  Keep Cardiologists appt - buPROPion (WELLBUTRIN XL) 300 MG 24 hr tablet; Take 1 tablet (300 mg total) by mouth daily.  Dispense: 90 tablet; Refill: 3 - busPIRone (BUSPAR) 5 MG tablet; Take 1 tablet (5 mg total) by mouth 3 (three) times daily.  Dispense: 90 tablet; Refill: 3  2. Palpitations Keep Cardiologist follow up Stress management  Avoid caffeine      I discussed the assessment and treatment plan with the patient. The patient was provided an opportunity to ask questions and all were answered. The patient agreed with the plan and demonstrated an understanding of the instructions.   The patient was advised to call back or seek an in-person evaluation if the symptoms worsen or if the condition fails to improve as anticipated.  The above assessment and management plan was discussed with the patient. The patient verbalized understanding of and has agreed to the management plan. Patient is aware to call the clinic if symptoms persist or worsen. Patient is aware when to return to the clinic for a follow-up visit. Patient educated on when it is appropriate to go to the emergency department.   Time call ended:  9:20 AM  I provided 12 minutes of non-face-to-face time during this encounter.    Evelina Dun, FNP

## 2019-07-03 DIAGNOSIS — R920 Mammographic microcalcification found on diagnostic imaging of breast: Secondary | ICD-10-CM | POA: Diagnosis not present

## 2019-07-03 DIAGNOSIS — R928 Other abnormal and inconclusive findings on diagnostic imaging of breast: Secondary | ICD-10-CM | POA: Diagnosis not present

## 2019-07-04 ENCOUNTER — Other Ambulatory Visit: Payer: Self-pay | Admitting: Obstetrics and Gynecology

## 2019-07-04 DIAGNOSIS — R921 Mammographic calcification found on diagnostic imaging of breast: Secondary | ICD-10-CM

## 2019-07-04 DIAGNOSIS — R928 Other abnormal and inconclusive findings on diagnostic imaging of breast: Secondary | ICD-10-CM

## 2019-07-05 ENCOUNTER — Ambulatory Visit (HOSPITAL_COMMUNITY): Payer: BC Managed Care – PPO | Attending: Internal Medicine

## 2019-07-05 ENCOUNTER — Other Ambulatory Visit: Payer: Self-pay

## 2019-07-05 DIAGNOSIS — R002 Palpitations: Secondary | ICD-10-CM | POA: Diagnosis not present

## 2019-07-12 ENCOUNTER — Ambulatory Visit
Admission: RE | Admit: 2019-07-12 | Discharge: 2019-07-12 | Disposition: A | Discharge status: Home / Self Care | Payer: BC Managed Care – PPO | Source: Ambulatory Visit | Attending: Obstetrics and Gynecology | Admitting: Obstetrics and Gynecology

## 2019-07-12 ENCOUNTER — Other Ambulatory Visit: Payer: Self-pay

## 2019-07-12 DIAGNOSIS — R921 Mammographic calcification found on diagnostic imaging of breast: Secondary | ICD-10-CM

## 2019-07-12 DIAGNOSIS — N6011 Diffuse cystic mastopathy of right breast: Secondary | ICD-10-CM | POA: Diagnosis not present

## 2019-07-12 DIAGNOSIS — R928 Other abnormal and inconclusive findings on diagnostic imaging of breast: Secondary | ICD-10-CM

## 2019-07-19 ENCOUNTER — Telehealth: Payer: Self-pay | Admitting: Cardiovascular Disease

## 2019-07-19 NOTE — Telephone Encounter (Signed)
Patient calling for echo results 

## 2019-07-19 NOTE — Telephone Encounter (Signed)
Called patient, gave ECHO results and discussed a little further.

## 2019-07-23 ENCOUNTER — Other Ambulatory Visit: Payer: Self-pay | Admitting: Family

## 2019-07-23 DIAGNOSIS — F411 Generalized anxiety disorder: Secondary | ICD-10-CM

## 2019-08-16 ENCOUNTER — Telehealth: Payer: Self-pay | Admitting: Family

## 2019-08-16 NOTE — Telephone Encounter (Signed)
Please review and advise.

## 2019-08-19 MED ORDER — BUPROPION HCL ER (XL) 150 MG PO TB24: 150.0000 mg | ORAL_TABLET | Freq: Every day | ORAL | 2 refills | Status: DC

## 2019-08-19 NOTE — Telephone Encounter (Signed)
Patient notified

## 2019-08-19 NOTE — Telephone Encounter (Signed)
Decrease Wellbutrin to 150 mg from 300 mg. Prescription sent to pharmacy.

## 2019-08-22 ENCOUNTER — Telehealth: Payer: BC Managed Care – PPO | Admitting: Family

## 2019-08-30 ENCOUNTER — Telehealth: Payer: BC Managed Care – PPO | Admitting: Cardiovascular Disease

## 2019-09-21 NOTE — Progress Notes (Signed)
Cardiology Office Note:   Date:  09/23/2019  NAME:  [name]    MRN: [id number] DOB:  12-Mar-1976   PCP:  Sharion Balloon, FNP  Cardiologist:  No primary care provider on file.   Referring MD: Sharion Balloon, FNP   Chief Complaint  Patient presents with   Palpitations   History of Present Illness:   [name] is a 44 y.o. female with a hx of anxiety who presents for follow-up of palpitations. Mild bileaflet MV prolapse on echo. Zio without arrhythmia presents for follow-up today.  She reports he is doing well since her last visit.  She is reassured that her heart palpitations or not any serious cardiac pathology.  She reports that her episodes have occurred much less frequently than they were occurring.  It appears her stress level is much lower.  She reports she still gets some episodes of palpitations at last seconds.  They occur 2-3 times per week.  No identifiable trigger.  Somewhat associated with stress.  She has no limitations with her current level of activity such as chest pain or shortness of breath.  She appears to be doing well.  We did go over the results of her echocardiogram which shows bileaflet mitral valve prolapse.  There is no evidence of any significant regurgitation.  She has no murmur on examination.  She will need to see a cardiologist every 1 to 2 years.  Problem List 1. Mitral valve prolapse with trivial regurgitation   Past Medical History: Past Medical History:  Diagnosis Date   Anemia    [facility]   Dysplasia of cervix, low grade (CIN 1)    History of chicken pox    Overactive bladder    MILD   Yeast vaginitis     Past Surgical History: Past Surgical History:  Procedure Laterality Date   CERVICAL CONIZATION W/BX  12/15/2010   Procedure: CONIZATION CERVIX WITH BIOPSY;  Surgeon: Delice Lesch, MD;  Location: [facility];  Service: Gynecology;  Laterality: N/A;    ESOPHAGOGASTRODUODENOSCOPY N/A 01/23/2015   SLF: abdominal pain due to GERD, gastririts, and IBS-C   LAPAROSCOPIC HYSTERECTOMY  11/30/2011   Procedure: HYSTERECTOMY TOTAL LAPAROSCOPIC;  Surgeon: Delice Lesch, MD;  Location: [facility];  Service: Gynecology;  Laterality: N/A;  with cysto   wisdom teethextraction      Current Medications: Current Meds  Medication Sig   buPROPion (WELLBUTRIN XL) 150 MG 24 hr tablet Take 1 tablet (150 mg total) by mouth daily.   busPIRone (BUSPAR) 5 MG tablet Take 1 tablet (5 mg total) by mouth 3 (three) times daily.   Multiple Vitamin (MULTIVITAMIN) capsule Take 1 capsule by mouth daily.     Allergies:    Patient has no known allergies.   Social History: Social History   Socioeconomic History   Marital status: Married    Spouse name: Not on file   Number of children: 1   Years of education: Not on file   Highest education level: Not on file  Occupational History   Occupation: stay at home mom  Tobacco Use   Smoking status: Never Smoker   Smokeless tobacco: Never Used  Substance and Sexual Activity   Alcohol use: Yes    Alcohol/week: 1.0 standard drink    Types: 1 Standard drinks or equivalent per week    Comment: rare etoh use   Drug use: No   Sexual activity: Yes  Partners: Male    Comment: Hysterectomy  Other Topics Concern   Not on file  Social History Narrative   Not on file   Social Determinants of Health   Financial Resource Strain:    Difficulty of Paying Living Expenses:   Food Insecurity:    Worried About Charity fundraiser in the Last Year:    Arboriculturist in the Last Year:   Transportation Needs:    Film/video editor (Medical):    Lack of Transportation (Non-Medical):   Physical Activity:    Days of Exercise per Week:    Minutes of Exercise per Session:   Stress:    Feeling of Stress :   Social Connections:    Frequency of Communication with Friends and Family:    Frequency of  Social Gatherings with Friends and Family:    Attends Religious Services:    Active Member of Clubs or Organizations:    Attends Music therapist:    Marital Status:      Family History: The patient's family history includes Cancer in her father and mother; Diabetes in her maternal grandfather and paternal grandfather; Emphysema in her paternal grandmother; Heart disease in her maternal grandmother and paternal grandfather; Leukemia in her paternal uncle; Stroke in her paternal grandfather. There is no history of Colon cancer.  ROS:   All other ROS reviewed and negative. Pertinent positives noted in the HPI.     EKGs/Labs/Other Studies Reviewed:   The following studies were personally reviewed by me today:  TTE 07/05/2019 1. Left ventricular ejection fraction, by estimation, is 55 to 60%. The  left ventricle has normal function. The left ventricle has no regional  wall motion abnormalities. Left ventricular diastolic parameters were  normal.  2. Right ventricular systolic function is normal. The right ventricular  size is normal. There is normal pulmonary artery systolic pressure. The  estimated right ventricular systolic pressure is 62.8 mmHg.  3. Mild bileaflet mitral valve prolapse with probable mild mitral annular  disjunction. The mitral valve is abnormal. Trivial mitral valve  regurgitation.  4. The aortic valve is tricuspid. Aortic valve regurgitation is not  visualized. No aortic stenosis is present.  5. The inferior vena cava is normal in size with greater than 50%  respiratory variability, suggesting right atrial pressure of 3 mmHg.  7 day zio 06/23/2019 1. Brief 4 beat run of ectopic atrial tachycardia over 7 day period of 1.8 second duration.  2. No significant arrhythmias.    Recent Labs: 05/16/2019: ALT 8; BUN 10; Creatinine, Ser 0.77; Hemoglobin 14.1; Platelets 207; Potassium 4.1; Sodium 138; TSH 4.120   Recent Lipid Panel    Component Value  Date/Time   CHOL 202 (H) 05/16/2019 1123   TRIG 94 05/16/2019 1123   HDL 57 05/16/2019 1123   CHOLHDL 3.5 05/16/2019 1123   LDLCALC 128 (H) 05/16/2019 1123    Physical Exam:   VS:  BP 124/80    Pulse 81    Temp 98.4 F (36.9 C)    Ht 5' 5.5" (1.664 m)    Wt 126 lb (57.2 kg)    LMP 11/27/2011    SpO2 99%    BMI 20.65 kg/m    Wt Readings from Last 3 Encounters:  09/23/19 126 lb (57.2 kg)  05/31/19 129 lb (58.5 kg)  05/16/19 127 lb 6.4 oz (57.8 kg)    General: Well nourished, well developed, in no acute distress Heart: Atraumatic, normal size  Eyes: PEERLA,  EOMI  Neck: Supple, no JVD Endocrine: No thryomegaly Cardiac: Normal S1, S2; RRR; no murmurs, rubs, or gallops Lungs: Clear to auscultation bilaterally, no wheezing, rhonchi or rales  Abd: Soft, nontender, no hepatomegaly  Ext: No edema, pulses 2+ Musculoskeletal: No deformities, BUE and BLE strength normal and equal Skin: Warm and dry, no rashes   Neuro: Alert and oriented to person, place, time, and situation, CNII-XII grossly intact, no focal deficits  Psych: Normal mood and affect   ASSESSMENT:   YOBANA CULLITON is a 44 y.o. female who presents for the following: 1. Mitral valve prolapse   2. Palpitations     PLAN:   1. Mitral valve prolapse -Bileaflet prolapse on echo.  No regurgitation.  She will check in with [place] every 2 years.  This may never become an issue for her.  If she has episodes of chest pain or shortness of breath she should reach back out to [place].  She was counseled on that.  She should also be evaluated by her primary care physician at least once per year to evaluate for any murmur.  2. Palpitations -Zio patch without significant arrhythmias.  Echo without significant structural heart disease.  Symptoms are better.  They were stress related.  No further work-up.  Disposition: Return in about 2 years (around 09/22/2021).  Medication Adjustments/Labs and Tests Ordered: Current medicines are reviewed at  length with the patient today.  Concerns regarding medicines are outlined above.  No orders of the defined types were placed in this encounter.  No orders of the defined types were placed in this encounter.   Patient Instructions  Medication Instructions:  The current medical regimen is effective;  continue present plan and medications.  *If you need a refill on your cardiac medications before your next appointment, please call your pharmacy*   Follow-Up: At [facility], you and your health needs are our priority.  As part of our continuing mission to provide you with exceptional heart care, we have created designated Provider Care Teams.  These Care Teams include your primary Cardiologist (physician) and Advanced Practice Providers (APPs -  Physician Assistants and Nurse Practitioners) who all work together to provide you with the care you need, when you need it.  We recommend signing up for the patient portal called "MyChart".  Sign up information is provided on this After Visit Summary.  MyChart is used to connect with patients for Virtual Visits (Telemedicine).  Patients are able to view lab/test results, encounter notes, upcoming appointments, etc.  Non-urgent messages can be sent to your provider as well.   To learn more about what you can do with MyChart, go to [email or web address].    Your next appointment:   2 year(s)  The format for your next appointment:   In Person  Provider:   Eleonore Chiquito, MD        Time Spent with Patient: I have spent a total of 20 minutes with patient reviewing hospital notes, telemetry, EKGs, labs and examining the patient as well as establishing an assessment and plan that was discussed with the patient.  > 50% of time was spent in direct patient care.  Signed, Addison Naegeli. Audie Box, [place]  [address] [place], [place] [address] [phone]  09/23/2019 1:35 PM

## 2019-09-23 ENCOUNTER — Encounter: Payer: Self-pay | Admitting: Cardiovascular Disease

## 2019-09-23 ENCOUNTER — Ambulatory Visit: Payer: BC Managed Care – PPO | Admitting: Cardiovascular Disease

## 2019-09-23 ENCOUNTER — Other Ambulatory Visit: Payer: Self-pay

## 2019-09-23 VITALS — BP 124/80 | HR 81 | Temp 98.4°F | Ht 65.5 in | Wt 126.0 lb

## 2019-09-23 DIAGNOSIS — R002 Palpitations: Secondary | ICD-10-CM | POA: Diagnosis not present

## 2019-09-23 DIAGNOSIS — I341 Nonrheumatic mitral (valve) prolapse: Secondary | ICD-10-CM | POA: Diagnosis not present

## 2019-09-23 NOTE — Patient Instructions (Signed)
Medication Instructions:  The current medical regimen is effective;  continue present plan and medications.  *If you need a refill on your cardiac medications before your next appointment, please call your pharmacy*   Follow-Up: At [facility], you and your health needs are our priority.  As part of our continuing mission to provide you with exceptional heart care, we have created designated Provider Care Teams.  These Care Teams include your primary Cardiologist (physician) and Advanced Practice Providers (APPs -  Physician Assistants and Nurse Practitioners) who all work together to provide you with the care you need, when you need it.  We recommend signing up for the patient portal called "MyChart".  Sign up information is provided on this After Visit Summary.  MyChart is used to connect with patients for Virtual Visits (Telemedicine).  Patients are able to view lab/test results, encounter notes, upcoming appointments, etc.  Non-urgent messages can be sent to your provider as well.   To learn more about what you can do with MyChart, go to [email or web address].    Your next appointment:   2 year(s)  The format for your next appointment:   In Person  Provider:   Trumann O'Neal, MD    

## 2019-12-31 ENCOUNTER — Encounter: Payer: Self-pay | Admitting: Family

## 2019-12-31 ENCOUNTER — Ambulatory Visit: Payer: BC Managed Care – PPO | Admitting: Family

## 2019-12-31 ENCOUNTER — Other Ambulatory Visit: Payer: Self-pay

## 2019-12-31 VITALS — BP 108/73 | HR 68 | Temp 98.0°F | Ht 65.6 in | Wt 125.4 lb

## 2019-12-31 DIAGNOSIS — K219 Gastro-esophageal reflux disease without esophagitis: Secondary | ICD-10-CM | POA: Diagnosis not present

## 2019-12-31 DIAGNOSIS — R002 Palpitations: Secondary | ICD-10-CM | POA: Diagnosis not present

## 2019-12-31 DIAGNOSIS — Z1159 Encounter for screening for other viral diseases: Secondary | ICD-10-CM | POA: Diagnosis not present

## 2019-12-31 DIAGNOSIS — Z23 Encounter for immunization: Secondary | ICD-10-CM | POA: Diagnosis not present

## 2019-12-31 DIAGNOSIS — F411 Generalized anxiety disorder: Secondary | ICD-10-CM | POA: Diagnosis not present

## 2019-12-31 NOTE — Patient Instructions (Signed)

## 2019-12-31 NOTE — Progress Notes (Signed)
Subjective:    Patient ID: [name], female    DOB: 28-Mar-1976, 44 y.o.   MRN: [id number]  Chief Complaint  Patient presents with  . Anxiety    6 mth follow up    Pt presents to the office today for chronic follow up. Pt has seen Cardiologists in the past with a negative Holter monitor and ECHO.  Anxiety Presents for follow-up visit. Symptoms include excessive worry, irritability, nervous/anxious behavior, palpitations and restlessness. Symptoms occur most days. The severity of symptoms is moderate.    Gastroesophageal Reflux She complains of belching and heartburn. This is a chronic problem. The current episode started more than 1 year ago. The problem occurs occasionally.  Palpitations  This is a chronic problem. The current episode started more than 1 year ago. The problem has been waxing and waning. Associated symptoms include anxiety. The treatment provided mild relief.      Review of Systems  Constitutional: Positive for irritability.  Cardiovascular: Positive for palpitations.  Gastrointestinal: Positive for heartburn.  Psychiatric/Behavioral: The patient is nervous/anxious.   All other systems reviewed and are negative.      Objective:   Physical Exam Vitals reviewed.  Constitutional:      General: She is not in acute distress.    Appearance: She is well-developed.  HENT:     Head: Normocephalic and atraumatic.     Right Ear: Tympanic membrane normal.     Left Ear: Tympanic membrane normal.  Eyes:     Pupils: Pupils are equal, round, and reactive to light.  Neck:     Thyroid: No thyromegaly.  Cardiovascular:     Rate and Rhythm: Normal rate and regular rhythm.     Heart sounds: Normal heart sounds. No murmur heard.   Pulmonary:     Effort: Pulmonary effort is normal. No respiratory distress.     Breath sounds: Normal breath sounds. No wheezing.  Abdominal:     General: Bowel sounds are normal. There is no distension.     Palpations: Abdomen is  soft.     Tenderness: There is no abdominal tenderness.  Musculoskeletal:        General: No tenderness. Normal range of motion.     Cervical back: Normal range of motion and neck supple.  Skin:    General: Skin is warm and dry.  Neurological:     Mental Status: She is alert and oriented to person, place, and time.     Cranial Nerves: No cranial nerve deficit.     Deep Tendon Reflexes: Reflexes are normal and symmetric.  Psychiatric:        Mood and Affect: Mood is anxious.        Behavior: Behavior normal.        Thought Content: Thought content normal.        Judgment: Judgment normal.       BP 108/73   Pulse 68   Temp 98 F (36.7 C) (Temporal)   Ht 5' 5.6" (1.666 m)   Wt 125 lb 6.4 oz (56.9 kg)   LMP 11/27/2011   BMI 20.49 kg/m      Assessment & Plan:  [name] comes in today with chief complaint of Anxiety (6 mth follow up )   Diagnosis and orders addressed:  1. Need for immunization against influenza - Flu Vaccine QUAD 36+ mos IM - CMP14+EGFR - CBC with Differential/Platelet  2. GAD (generalized anxiety disorder) - CMP14+EGFR - CBC with Differential/Platelet - TSH  3. Gastroesophageal reflux disease without esophagitis - CMP14+EGFR - CBC with Differential/Platelet  4. Palpitations - CMP14+EGFR - CBC with Differential/Platelet - TSH  5. Need for hepatitis C screening test - CMP14+EGFR - CBC with Differential/Platelet - Hepatitis C antibody  Pt will start Buspar 5 mg at lunch to see if this helps with evening palpitations  Labs pending Health Maintenance reviewed Diet and exercise encouraged  Follow up plan: 6 months    Jade Dun, FNP

## 2020-01-01 LAB — CBC WITH DIFFERENTIAL/PLATELET
Basophils Absolute: 0 10*3/uL (ref 0.0–0.2)
Basos: 1 %
EOS (ABSOLUTE): 0 10*3/uL (ref 0.0–0.4)
Eos: 0 %
Hematocrit: 40.1 % (ref 34.0–46.6)
Hemoglobin: 13.3 g/dL (ref 11.1–15.9)
Immature Grans (Abs): 0 10*3/uL (ref 0.0–0.1)
Immature Granulocytes: 0 %
Lymphocytes Absolute: 1.5 10*3/uL (ref 0.7–3.1)
Lymphs: 25 %
MCH: 31.1 pg (ref 26.6–33.0)
MCHC: 33.2 g/dL (ref 31.5–35.7)
MCV: 94 fL (ref 79–97)
Monocytes Absolute: 0.4 10*3/uL (ref 0.1–0.9)
Monocytes: 7 %
Neutrophils Absolute: 4 10*3/uL (ref 1.4–7.0)
Neutrophils: 67 %
Platelets: 204 10*3/uL (ref 150–450)
RBC: 4.28 x10E6/uL (ref 3.77–5.28)
RDW: 13 % (ref 11.7–15.4)
WBC: 5.9 10*3/uL (ref 3.4–10.8)

## 2020-01-01 LAB — CMP14+EGFR
ALT: 10 IU/L (ref 0–32)
AST: 18 IU/L (ref 0–40)
Albumin/Globulin Ratio: 2 (ref 1.2–2.2)
Albumin: 4.4 g/dL (ref 3.8–4.8)
Alkaline Phosphatase: 53 IU/L (ref 44–121)
BUN/Creatinine Ratio: 14 (ref 9–23)
BUN: 10 mg/dL (ref 6–24)
Bilirubin Total: 0.3 mg/dL (ref 0.0–1.2)
CO2: 22 mmol/L (ref 20–29)
Calcium: 9.8 mg/dL (ref 8.7–10.2)
Chloride: 102 mmol/L (ref 96–106)
Creatinine, Ser: 0.72 mg/dL (ref 0.57–1.00)
GFR calc Af Amer: 117 mL/min/{1.73_m2} (ref >59)
GFR calc non Af Amer: 101 mL/min/{1.73_m2} (ref >59)
Globulin, Total: 2.2 g/dL (ref 1.5–4.5)
Glucose: 81 mg/dL (ref 65–99)
Potassium: 4.1 mmol/L (ref 3.5–5.2)
Sodium: 139 mmol/L (ref 134–144)
Total Protein: 6.6 g/dL (ref 6.0–8.5)

## 2020-01-01 LAB — HEPATITIS C ANTIBODY: Hep C Virus Ab: <0.1 s/co ratio (ref 0.0–0.9)

## 2020-01-01 LAB — TSH: TSH: 3.8 u[IU]/mL (ref 0.450–4.500)

## 2020-06-25 ENCOUNTER — Telehealth: Payer: BC Managed Care – PPO | Admitting: Family

## 2020-06-25 ENCOUNTER — Encounter: Payer: Self-pay | Admitting: Family

## 2020-06-25 DIAGNOSIS — F411 Generalized anxiety disorder: Secondary | ICD-10-CM | POA: Diagnosis not present

## 2020-06-25 DIAGNOSIS — R002 Palpitations: Secondary | ICD-10-CM

## 2020-06-25 MED ORDER — BUPROPION HCL ER (XL) 150 MG PO TB24: 150.0000 mg | ORAL_TABLET | Freq: Every day | ORAL | 2 refills | Status: DC

## 2020-06-25 MED ORDER — BUSPIRONE HCL 5 MG PO TABS: 5.0000 mg | ORAL_TABLET | Freq: Three times a day (TID) | ORAL | 3 refills | Status: DC

## 2020-06-25 NOTE — Progress Notes (Signed)
   Virtual Visit via telephone Note Due to COVID-19 pandemic this visit was conducted virtually. This visit type was conducted due to national recommendations for restrictions regarding the COVID-19 Pandemic (e.g. social distancing, sheltering in place) in an effort to limit this patient's exposure and mitigate transmission in our community. All issues noted in this document were discussed and addressed.  A physical exam was not performed with this format.  I connected with [name] on 06/25/20 at 4:29 pm  by telephone and verified that I am speaking with the correct person using two identifiers. [name] is currently located at home and no one is currently with her during visit. The provider, Evelina Dun, FNP is located in their office at time of visit.  I discussed the limitations, risks, security and privacy concerns of performing an evaluation and management service by telephone and the availability of in person appointments. I also discussed with the patient that there may be a patient responsible charge related to this service. The patient expressed understanding and agreed to proceed.   History and Present Illness:  Pt presents to the office today for chronic follow up. Pt has seen Cardiologists in the past for palpitations with a negative Holter monitor and ECHO.  Anxiety Presents for follow-up visit. Symptoms include excessive worry, irritability, nervous/anxious behavior and restlessness. Symptoms occur most days. The severity of symptoms is moderate.        Review of Systems  Constitutional: Positive for irritability.  Psychiatric/Behavioral: The patient is nervous/anxious.   All other systems reviewed and are negative.    Observations/Objective: No SOB or distress noted   Assessment and Plan: 1. GAD (generalized anxiety disorder) Continue current medications  Stress management  - buPROPion (WELLBUTRIN XL) 150 MG 24 hr tablet; Take 1 tablet (150 mg  total) by mouth daily.  Dispense: 90 tablet; Refill: 2 - busPIRone (BUSPAR) 5 MG tablet; Take 1 tablet (5 mg total) by mouth 3 (three) times daily.  Dispense: 90 tablet; Refill: 3  2. Palpitations Avoid caffeine    Follow Up Instructions: 6 months     I discussed the assessment and treatment plan with the patient. The patient was provided an opportunity to ask questions and all were answered. The patient agreed with the plan and demonstrated an understanding of the instructions.   The patient was advised to call back or seek an in-person evaluation if the symptoms worsen or if the condition fails to improve as anticipated.  The above assessment and management plan was discussed with the patient. The patient verbalized understanding of and has agreed to the management plan. Patient is aware to call the clinic if symptoms persist or worsen. Patient is aware when to return to the clinic for a follow-up visit. Patient educated on when it is appropriate to go to the emergency department.   Time call ended: 4:40 pm    I provided 11 minutes of non-face-to-face time during this encounter.    Evelina Dun, FNP

## 2020-06-26 ENCOUNTER — Ambulatory Visit: Payer: BC Managed Care – PPO | Admitting: Family

## 2020-06-29 ENCOUNTER — Ambulatory Visit: Payer: BC Managed Care – PPO | Admitting: Family

## 2020-07-14 ENCOUNTER — Other Ambulatory Visit: Payer: Self-pay | Admitting: Family

## 2020-07-14 DIAGNOSIS — F411 Generalized anxiety disorder: Secondary | ICD-10-CM

## 2020-07-20 ENCOUNTER — Ambulatory Visit: Payer: BC Managed Care – PPO | Admitting: Family

## 2020-08-12 DIAGNOSIS — Z1211 Encounter for screening for malignant neoplasm of colon: Secondary | ICD-10-CM | POA: Diagnosis not present

## 2020-08-12 DIAGNOSIS — Z1231 Encounter for screening mammogram for malignant neoplasm of breast: Secondary | ICD-10-CM | POA: Diagnosis not present

## 2020-08-12 DIAGNOSIS — Z01419 Encounter for gynecological examination (general) (routine) without abnormal findings: Secondary | ICD-10-CM | POA: Diagnosis not present

## 2020-08-12 DIAGNOSIS — D069 Carcinoma in situ of cervix, unspecified: Secondary | ICD-10-CM | POA: Diagnosis not present

## 2020-12-28 ENCOUNTER — Encounter: Payer: Self-pay | Admitting: Family

## 2020-12-28 ENCOUNTER — Other Ambulatory Visit: Payer: Self-pay

## 2020-12-28 ENCOUNTER — Ambulatory Visit: Payer: BC Managed Care – PPO | Admitting: Family

## 2020-12-28 VITALS — BP 115/77 | HR 74 | Temp 98.0°F | Ht 65.6 in | Wt 126.2 lb

## 2020-12-28 DIAGNOSIS — Z0001 Encounter for general adult medical examination with abnormal findings: Secondary | ICD-10-CM

## 2020-12-28 DIAGNOSIS — Z23 Encounter for immunization: Secondary | ICD-10-CM

## 2020-12-28 DIAGNOSIS — K5900 Constipation, unspecified: Secondary | ICD-10-CM

## 2020-12-28 DIAGNOSIS — K219 Gastro-esophageal reflux disease without esophagitis: Secondary | ICD-10-CM | POA: Diagnosis not present

## 2020-12-28 DIAGNOSIS — F411 Generalized anxiety disorder: Secondary | ICD-10-CM

## 2020-12-28 DIAGNOSIS — R002 Palpitations: Secondary | ICD-10-CM

## 2020-12-28 DIAGNOSIS — Z0000 Encounter for general adult medical examination without abnormal findings: Secondary | ICD-10-CM | POA: Diagnosis not present

## 2020-12-28 DIAGNOSIS — Z1211 Encounter for screening for malignant neoplasm of colon: Secondary | ICD-10-CM

## 2020-12-28 DIAGNOSIS — E039 Hypothyroidism, unspecified: Secondary | ICD-10-CM | POA: Diagnosis not present

## 2020-12-28 MED ORDER — BUPROPION HCL ER (XL) 150 MG PO TB24: 150.0000 mg | ORAL_TABLET | Freq: Every day | ORAL | 2 refills | Status: DC

## 2020-12-28 MED ORDER — BUSPIRONE HCL 5 MG PO TABS: 5.0000 mg | ORAL_TABLET | Freq: Three times a day (TID) | ORAL | 3 refills | Status: DC

## 2020-12-28 NOTE — Progress Notes (Signed)
Subjective:    Patient ID: [name], female    DOB: [birth year], 45 y.o.   MRN: [id number]  Chief Complaint  Patient presents with   Medical Management of Chronic Issues   Pt presents to the office today for CPE without pap and chronic follow up. Pt has seen Cardiologists in the past for palpitations with a negative Holter monitor and ECHO.   She is followed by GYN annually.  Gastroesophageal Reflux She complains of belching and heartburn. This is a chronic problem. The current episode started more than 1 year ago. Associated symptoms include fatigue. She has tried a diet change for the symptoms. The treatment provided moderate relief.  Thyroid Problem Presents for follow-up visit. Symptoms include anxiety, constipation and fatigue. Patient reports no diarrhea or dry skin.  Anxiety Presents for follow-up visit. Symptoms include excessive worry, irritability and nervous/anxious behavior. Symptoms occur most days. The severity of symptoms is moderate.    Constipation This is a chronic problem. The current episode started more than 1 year ago. The problem has been waxing and waning since onset. Her stool frequency is 2 to 3 times per week. Pertinent negatives include no diarrhea. She has tried diet changes for the symptoms. The treatment provided moderate relief.     Review of Systems  Constitutional:  Positive for fatigue and irritability.  Gastrointestinal:  Positive for constipation and heartburn. Negative for diarrhea.  Psychiatric/Behavioral:  The patient is nervous/anxious.   All other systems reviewed and are negative.  Family History  Problem Relation Age of Onset   Cancer Father        lung cancer, deceased   Cancer Mother        brain cancer, deceased   Heart disease Maternal Grandmother    Heart disease Paternal Grandfather        CHF    Diabetes Paternal Grandfather    Stroke Paternal Grandfather    Emphysema Paternal Grandmother    Diabetes Maternal  Grandfather    Leukemia Paternal Uncle    Colon cancer Neg Hx    Social History   Socioeconomic History   Marital status: Married    Spouse name: Not on file   Number of children: 1   Years of education: Not on file   Highest education level: Not on file  Occupational History   Occupation: stay at home mom  Tobacco Use   Smoking status: Never   Smokeless tobacco: Never  Substance and Sexual Activity   Alcohol use: Yes    Alcohol/week: 1.0 standard drink    Types: 1 Standard drinks or equivalent per week    Comment: rare etoh use   Drug use: No   Sexual activity: Yes    Partners: Male    Comment: Hysterectomy  Other Topics Concern   Not on file  Social History Narrative   Not on file   Social Determinants of Health   Financial Resource Strain: Not on file  Food Insecurity: Not on file  Transportation Needs: Not on file  Physical Activity: Not on file  Stress: Not on file  Social Connections: Not on file       Objective:   Physical Exam Vitals reviewed.  Constitutional:      General: She is not in acute distress.    Appearance: She is well-developed.  HENT:     Head: Normocephalic and atraumatic.     Right Ear: Tympanic membrane normal.     Left Ear: Tympanic membrane normal.  Eyes:     Pupils: Pupils are equal, round, and reactive to light.  Neck:     Thyroid: No thyromegaly.  Cardiovascular:     Rate and Rhythm: Normal rate and regular rhythm.     Heart sounds: Normal heart sounds. No murmur heard. Pulmonary:     Effort: Pulmonary effort is normal. No respiratory distress.     Breath sounds: Normal breath sounds. No wheezing.  Abdominal:     General: Bowel sounds are normal. There is no distension.     Palpations: Abdomen is soft.     Tenderness: There is no abdominal tenderness.  Musculoskeletal:        General: No tenderness. Normal range of motion.     Cervical back: Normal range of motion and neck supple.  Skin:    General: Skin is warm and  dry.  Neurological:     Mental Status: She is alert and oriented to person, place, and time.     Cranial Nerves: No cranial nerve deficit.     Deep Tendon Reflexes: Reflexes are normal and symmetric.  Psychiatric:        Behavior: Behavior normal.        Thought Content: Thought content normal.        Judgment: Judgment normal.      BP 115/77   Pulse 74   Temp 98 F (36.7 C) (Temporal)   Ht 5' 5.6" (1.666 m)   Wt 126 lb 3.2 oz (57.2 kg)   LMP 11/27/2011   BMI 20.62 kg/m      Assessment & Plan:  JOANY KHATIB comes in today with chief complaint of Medical Management of Chronic Issues   Diagnosis and orders addressed:  1. GAD (generalized anxiety disorder) - busPIRone (BUSPAR) 5 MG tablet; Take 1 tablet (5 mg total) by mouth 3 (three) times daily.  Dispense: 90 tablet; Refill: 3 - buPROPion (WELLBUTRIN XL) 150 MG 24 hr tablet; Take 1 tablet (150 mg total) by mouth daily.  Dispense: 90 tablet; Refill: 2 - CMP14+EGFR - CBC with Differential/Platelet  2. Need for immunization against influenza - Flu Vaccine QUAD 49moIM (Fluarix, Fluzone & Alfiuria Quad PF) - CMP14+EGFR - CBC with Differential/Platelet  3. Gastroesophageal reflux disease without esophagitis - CMP14+EGFR - CBC with Differential/Platelet  4. Colon cancer screening - Ambulatory referral to Gastroenterology - CMP14+EGFR - CBC with Differential/Platelet  5. Annual physical exam - Ambulatory referral to Gastroenterology - CMP14+EGFR - CBC with Differential/Platelet - Lipid panel - TSH  6. Hypothyroidism, unspecified type - CMP14+EGFR - CBC with Differential/Platelet - TSH  7. Palpitations - CMP14+EGFR - CBC with Differential/Platelet  8. Constipation, unspecified constipation type - CMP14+EGFR - CBC with Differential/Platelet   Labs pending Health Maintenance reviewed Diet and exercise encouraged  Follow up plan: 1 year    CEvelina Dun FNP

## 2020-12-28 NOTE — Patient Instructions (Signed)
Health Maintenance, Female Adopting a healthy lifestyle and getting preventive care are important in promoting health and wellness. Ask your health care provider about: The right schedule for you to have regular tests and exams. Things you can do on your own to prevent diseases and keep yourself healthy. What should I know about diet, weight, and exercise? Eat a healthy diet  Eat a diet that includes plenty of vegetables, fruits, low-fat dairy products, and lean protein. Do not eat a lot of foods that are high in solid fats, added sugars, or sodium. Maintain a healthy weight Body mass index (BMI) is used to identify weight problems. It estimates body fat based on height and weight. Your health care provider can help determine your BMI and help you achieve or maintain a healthy weight. Get regular exercise Get regular exercise. This is one of the most important things you can do for your health. Most adults should: Exercise for at least 150 minutes each week. The exercise should increase your heart rate and make you sweat (moderate-intensity exercise). Do strengthening exercises at least twice a week. This is in addition to the moderate-intensity exercise. Spend less time sitting. Even light physical activity can be beneficial. Watch cholesterol and blood lipids Have your blood tested for lipids and cholesterol at 46 years of age, then have this test every 5 years. Have your cholesterol levels checked more often if: Your lipid or cholesterol levels are high. You are older than 46 years of age. You are at high risk for heart disease. What should I know about cancer screening? Depending on your health history and family history, you may need to have cancer screening at various ages. This may include screening for: Breast cancer. Cervical cancer. Colorectal cancer. Skin cancer. Lung cancer. What should I know about heart disease, diabetes, and high blood pressure? Blood pressure and heart  disease High blood pressure causes heart disease and increases the risk of stroke. This is more likely to develop in people who have high blood pressure readings, are of African descent, or are overweight. Have your blood pressure checked: Every 3-5 years if you are 18-39 years of age. Every year if you are 40 years old or older. Diabetes Have regular diabetes screenings. This checks your fasting blood sugar level. Have the screening done: Once every three years after age 40 if you are at a normal weight and have a low risk for diabetes. More often and at a younger age if you are overweight or have a high risk for diabetes. What should I know about preventing infection? Hepatitis B If you have a higher risk for hepatitis B, you should be screened for this virus. Talk with your health care provider to find out if you are at risk for hepatitis B infection. Hepatitis C Testing is recommended for: Everyone born from 1945 through 1965. Anyone with known risk factors for hepatitis C. Sexually transmitted infections (STIs) Get screened for STIs, including gonorrhea and chlamydia, if: You are sexually active and are younger than 46 years of age. You are older than 46 years of age and your health care provider tells you that you are at risk for this type of infection. Your sexual activity has changed since you were last screened, and you are at increased risk for chlamydia or gonorrhea. Ask your health care provider if you are at risk. Ask your health care provider about whether you are at high risk for HIV. Your health care provider may recommend a prescription medicine   to help prevent HIV infection. If you choose to take medicine to prevent HIV, you should first get tested for HIV. You should then be tested every 3 months for as long as you are taking the medicine. Pregnancy If you are about to stop having your period (premenopausal) and you may become pregnant, seek counseling before you get  pregnant. Take 400 to 800 micrograms (mcg) of folic acid every day if you become pregnant. Ask for birth control (contraception) if you want to prevent pregnancy. Osteoporosis and menopause Osteoporosis is a disease in which the bones lose minerals and strength with aging. This can result in bone fractures. If you are 65 years old or older, or if you are at risk for osteoporosis and fractures, ask your health care provider if you should: Be screened for bone loss. Take a calcium or vitamin D supplement to lower your risk of fractures. Be given hormone replacement therapy (HRT) to treat symptoms of menopause. Follow these instructions at home: Lifestyle Do not use any products that contain nicotine or tobacco, such as cigarettes, e-cigarettes, and chewing tobacco. If you need help quitting, ask your health care provider. Do not use street drugs. Do not share needles. Ask your health care provider for help if you need support or information about quitting drugs. Alcohol use Do not drink alcohol if: Your health care provider tells you not to drink. You are pregnant, may be pregnant, or are planning to become pregnant. If you drink alcohol: Limit how much you use to 0-1 drink a day. Limit intake if you are breastfeeding. Be aware of how much alcohol is in your drink. In the [place] one drink equals one 12 oz bottle of beer (355 mL), one 5 oz glass of wine (148 mL), or one 1 oz glass of hard liquor (44 mL). General instructions Schedule regular health, dental, and eye exams. Stay current with your vaccines. Tell your health care provider if: You often feel depressed. You have ever been abused or do not feel safe at home. Summary Adopting a healthy lifestyle and getting preventive care are important in promoting health and wellness. Follow your health care provider's instructions about healthy diet, exercising, and getting tested or screened for diseases. Follow your health care provider's  instructions on monitoring your cholesterol and blood pressure. This information is not intended to replace advice given to you by your health care provider. Make sure you discuss any questions you have with your health care provider. Document Revised: 05/29/2020 Document Reviewed: 03/14/2018 Elsevier Patient Education  2022 Elsevier Inc.  

## 2020-12-29 LAB — CMP14+EGFR
ALT: 9 IU/L (ref 0–32)
AST: 16 IU/L (ref 0–40)
Albumin/Globulin Ratio: 1.9 (ref 1.2–2.2)
Albumin: 4.3 g/dL (ref 3.8–4.8)
Alkaline Phosphatase: 67 IU/L (ref 44–121)
BUN/Creatinine Ratio: 11 (ref 9–23)
BUN: 8 mg/dL (ref 6–24)
Bilirubin Total: 0.2 mg/dL (ref 0.0–1.2)
CO2: 22 mmol/L (ref 20–29)
Calcium: 9.4 mg/dL (ref 8.7–10.2)
Chloride: 105 mmol/L (ref 96–106)
Creatinine, Ser: 0.72 mg/dL (ref 0.57–1.00)
Globulin, Total: 2.3 g/dL (ref 1.5–4.5)
Glucose: 79 mg/dL (ref 70–99)
Potassium: 4.1 mmol/L (ref 3.5–5.2)
Sodium: 141 mmol/L (ref 134–144)
Total Protein: 6.6 g/dL (ref 6.0–8.5)
eGFR: 104 mL/min/{1.73_m2} (ref >59)

## 2020-12-29 LAB — CBC WITH DIFFERENTIAL/PLATELET
Basophils Absolute: 0 10*3/uL (ref 0.0–0.2)
Basos: 0 %
EOS (ABSOLUTE): 0 10*3/uL (ref 0.0–0.4)
Eos: 1 %
Hematocrit: 40.6 % (ref 34.0–46.6)
Hemoglobin: 13.2 g/dL (ref 11.1–15.9)
Immature Grans (Abs): 0 10*3/uL (ref 0.0–0.1)
Immature Granulocytes: 0 %
Lymphocytes Absolute: 1.3 10*3/uL (ref 0.7–3.1)
Lymphs: 18 %
MCH: 31 pg (ref 26.6–33.0)
MCHC: 32.5 g/dL (ref 31.5–35.7)
MCV: 95 fL (ref 79–97)
Monocytes Absolute: 0.7 10*3/uL (ref 0.1–0.9)
Monocytes: 9 %
Neutrophils Absolute: 4.9 10*3/uL (ref 1.4–7.0)
Neutrophils: 72 %
Platelets: 218 10*3/uL (ref 150–450)
RBC: 4.26 x10E6/uL (ref 3.77–5.28)
RDW: 12.9 % (ref 11.7–15.4)
WBC: 6.9 10*3/uL (ref 3.4–10.8)

## 2020-12-29 LAB — TSH: TSH: 4.45 u[IU]/mL (ref 0.450–4.500)

## 2020-12-29 LAB — LIPID PANEL
Chol/HDL Ratio: 4 ratio (ref 0.0–4.4)
Cholesterol, Total: 197 mg/dL (ref 100–199)
HDL: 49 mg/dL (ref >39)
LDL Chol Calc (NIH): 116 mg/dL — ABNORMAL HIGH (ref 0–99)
Triglycerides: 185 mg/dL — ABNORMAL HIGH (ref 0–149)
VLDL Cholesterol Cal: 32 mg/dL (ref 5–40)

## 2021-01-05 ENCOUNTER — Encounter: Payer: Self-pay | Admitting: Gastroenterology

## 2021-02-11 ENCOUNTER — Other Ambulatory Visit: Payer: Self-pay

## 2021-02-11 ENCOUNTER — Ambulatory Visit: Payer: BC Managed Care – PPO | Admitting: Family

## 2021-02-11 ENCOUNTER — Encounter: Payer: Self-pay | Admitting: Family

## 2021-02-11 VITALS — BP 130/83 | HR 78 | Temp 97.7°F | Ht 65.6 in | Wt 125.6 lb

## 2021-02-11 DIAGNOSIS — G44209 Tension-type headache, unspecified, not intractable: Secondary | ICD-10-CM

## 2021-02-11 DIAGNOSIS — F411 Generalized anxiety disorder: Secondary | ICD-10-CM

## 2021-02-11 DIAGNOSIS — M542 Cervicalgia: Secondary | ICD-10-CM | POA: Diagnosis not present

## 2021-02-11 DIAGNOSIS — J301 Allergic rhinitis due to pollen: Secondary | ICD-10-CM

## 2021-02-11 MED ORDER — CETIRIZINE HCL 10 MG PO TABS: 10.0000 mg | ORAL_TABLET | Freq: Every day | ORAL | 1 refills | Status: DC

## 2021-02-11 MED ORDER — FLUTICASONE PROPIONATE 50 MCG/ACT NA SUSP: 2.0000 | Freq: Every day | NASAL | 6 refills | Status: DC

## 2021-02-11 MED ORDER — BACLOFEN 5 MG PO TABS: 5.0000 mg | ORAL_TABLET | Freq: Two times a day (BID) | ORAL | 2 refills | Status: DC | PRN

## 2021-02-11 NOTE — Progress Notes (Signed)
Subjective:    Patient ID: [name], female    DOB: [birth year], 45 y.o.   MRN: [id number]  Chief Complaint  Patient presents with   Neck Pain    Moves all over started at the begging of October. Pain comes in goes in waves pain is not always the same.    Headache   Pt presents to the office today with generalized neck and head pain that started over the last two months.  Neck Pain  This is a new problem. The current episode started more than 1 month ago. The problem occurs daily. The problem has been waxing and waning. The pain is associated with nothing. The pain is present in the left side, right side and midline. The quality of the pain is described as aching. The pain is at a severity of 3/10 (at times it could be a 6-7 that lasts a few seconds). The pain is moderate. Associated symptoms include headaches. Pertinent negatives include no pain with swallowing, photophobia, tingling, trouble swallowing, visual change or weakness. She has tried NSAIDs for the symptoms. The treatment provided no relief.  Headache  Associated symptoms include neck pain. Pertinent negatives include no photophobia, tingling, visual change or weakness.     Review of Systems  HENT:  Negative for trouble swallowing.   Eyes:  Negative for photophobia.  Musculoskeletal:  Positive for neck pain.  Neurological:  Positive for headaches. Negative for tingling and weakness.  All other systems reviewed and are negative.     Objective:   Physical Exam Vitals reviewed.  Constitutional:      General: She is not in acute distress.    Appearance: She is well-developed.  HENT:     Head: Normocephalic and atraumatic.     Right Ear: External ear normal.     Nose:     Right Turbinates: Swollen.     Left Turbinates: Swollen.  Eyes:     Pupils: Pupils are equal, round, and reactive to light.  Neck:     Thyroid: No thyromegaly.     Comments: Full ROM of neck, no pain present at exam Cardiovascular:      Rate and Rhythm: Normal rate and regular rhythm.     Heart sounds: Normal heart sounds. No murmur heard. Pulmonary:     Effort: Pulmonary effort is normal. No respiratory distress.     Breath sounds: Normal breath sounds. No wheezing.  Abdominal:     General: Bowel sounds are normal. There is no distension.     Palpations: Abdomen is soft.     Tenderness: There is no abdominal tenderness.  Musculoskeletal:        General: No tenderness. Normal range of motion.     Cervical back: Normal range of motion and neck supple.  Skin:    General: Skin is warm and dry.  Neurological:     Mental Status: She is alert and oriented to person, place, and time.     Cranial Nerves: No cranial nerve deficit.     Deep Tendon Reflexes: Reflexes are normal and symmetric.  Psychiatric:        Behavior: Behavior normal.        Thought Content: Thought content normal.        Judgment: Judgment normal.     BP 130/83   Pulse 78   Temp 97.7 F (36.5 C) (Temporal)   Ht 5' 5.6" (1.666 m)   Wt 125 lb 9.6 oz (57 kg)   LMP  11/27/2011   BMI 20.52 kg/m       Assessment & Plan:  PERL FOLMAR comes in today with chief complaint of Neck Pain (Moves all over started at the begging of October. Pain comes in goes in waves pain is not always the same. ) and Headache   Diagnosis and orders addressed:  1. Neck pain - Baclofen 5 MG TABS; Take 5 mg by mouth 2 (two) times daily as needed.  Dispense: 90 tablet; Refill: 2  2. Allergic rhinitis due to pollen, unspecified seasonality Start zyrtec and flonase  Avoid allergens  - cetirizine (ZYRTEC) 10 MG tablet; Take 1 tablet (10 mg total) by mouth daily.  Dispense: 90 tablet; Refill: 1 - fluticasone (FLONASE) 50 MCG/ACT nasal spray; Place 2 sprays into both nostrils daily.  Dispense: 16 g; Refill: 6  3. Tension headache Stress management Motrin as needed Baclofen as needed - Baclofen 5 MG TABS; Take 5 mg by mouth 2 (two) times daily as needed.  Dispense: 90  tablet; Refill: 2  4. GAD (generalized anxiety disorder) Continue Wellbutrin  Buspar as needed      Evelina Dun, FNP

## 2021-02-11 NOTE — Patient Instructions (Signed)
Tension Headache, Adult ?A tension headache is a feeling of pain, pressure, or aching over the front and sides of the head. The pain can be dull, or it can feel tight. There are two types of tension headache: ?Episodic tension headache. This is when the headaches happen fewer than 15 days a month. ?Chronic tension headache. This is when the headaches happen more than 15 days a month during a 3-month period. ?A tension headache can last from 30 minutes to several days. It is the most common kind of headache. Tension headaches are not normally associated with nausea or vomiting, and they do not get worse with physical activity. ?What are the causes? ?The exact cause of this condition is not known. Tension headaches are often triggered by stress, anxiety, or depression. Other triggers may include: ?Alcohol. ?Too much caffeine or caffeine withdrawal. ?Respiratory infections, such as colds, flu, or sinus infections. ?Dental problems or teeth clenching. ?Fatigue. ?Holding your head and neck in the same position for a long period of time, such as while using a computer. ?Smoking. ?Arthritis of the neck. ?What are the signs or symptoms? ?Symptoms of this condition include: ?A feeling of pressure or tightness around the head. ?Dull, aching head pain. ?Pain over the front and sides of the head. ?Tenderness in the muscles of the head, neck, and shoulders. ?How is this diagnosed? ?This condition may be diagnosed based on your symptoms, your medical history, and a physical exam. ?If your symptoms are severe or unusual, you may have imaging tests, such as a CT scan or an MRI of your head. Your vision may also be checked. ?How is this treated? ?This condition may be treated with lifestyle changes and with medicines that help relieve symptoms. ?Follow these instructions at home: ?Managing pain ?Take over-the-counter and prescription medicines only as told by your health care provider. ?When you have a headache, lie down in a dark,  quiet room. ?If directed, put ice on your head and neck. To do this: ?Put ice in a plastic bag. ?Place a towel between your skin and the bag. ?Leave the ice on for 20 minutes, 2-3 times a day. ?Remove the ice if your skin turns bright red. This is very important. If you cannot feel pain, heat, or cold, you have a greater risk of damage to the area. ?If directed, apply heat to the back of your neck as often as told by your health care provider. Use the heat source that your health care provider recommends, such as a moist heat pack or a heating pad. ?Place a towel between your skin and the heat source. ?Leave the heat on for 20-30 minutes. ?Remove the heat if your skin turns bright red. This is especially important if you are unable to feel pain, heat, or cold. You have a greater risk of getting burned. ?Eating and drinking ?Eat meals on a regular schedule. ?If you drink alcohol: ?Limit how much you have to: ?0-1 drink a day for women who are not pregnant. ?0-2 drinks a day for men. ?Know how much alcohol is in your drink. In the [place] one drink equals one 12 oz bottle of beer (355 mL), one 5 oz glass of wine (148 mL), or one 1? oz glass of hard liquor (44 mL). ?Drink enough fluid to keep your urine pale yellow. ?Decrease your caffeine intake, or stop using caffeine. ?Lifestyle ?Get 7-9 hours of sleep each night, or get the amount of sleep recommended by your health care provider. ?At bedtime,   remove computers, phones, and tablets from your room. ?Find ways to manage your stress. This may include: ?Exercise. ?Deep breathing exercises. ?Yoga. ?Listening to music. ?Positive mental imagery. ?Try to sit up straight and avoid tensing your muscles. ?Do not use any products that contain nicotine or tobacco. These include cigarettes, chewing tobacco, and vaping devices, such as e-cigarettes. If you need help quitting, ask your health care provider. ?General instructions ? ?Avoid any headache triggers. Keep a journal to help  find out what may trigger your headaches. For example, write down: ?What you eat and drink. ?How much sleep you get. ?Any change to your diet or medicines. ?Keep all follow-up visits. This is important. ?Contact a health care provider if: ?Your headache does not get better. ?Your headache comes back. ?You are sensitive to sounds, light, or smells because of a headache. ?You have nausea or you vomit. ?Your stomach hurts. ?Get help right away if: ?You suddenly develop a severe headache, along with any of the following: ?A stiff neck. ?Nausea and vomiting. ?Confusion. ?Weakness in one part or one side of your body. ?Double vision or loss of vision. ?Shortness of breath. ?Rash. ?Unusual sleepiness. ?Fever or chills. ?Trouble speaking. ?Pain in your eye or ear. ?Trouble walking or balancing. ?Feeling faint or passing out. ?Summary ?A tension headache is a feeling of pain, pressure, or aching over the front and sides of the head. ?A tension headache can last from 30 minutes to several days. It is the most common kind of headache. ?This condition may be diagnosed based on your symptoms, your medical history, and a physical exam. ?This condition may be treated with lifestyle changes and with medicines that help relieve symptoms. ?This information is not intended to replace advice given to you by your health care provider. Make sure you discuss any questions you have with your health care provider. ?Document Revised: 12/19/2019 Document Reviewed: 12/19/2019 ?Elsevier Patient Education ? 2022 Elsevier Inc. ? ?

## 2021-04-16 DIAGNOSIS — H40033 Anatomical narrow angle, bilateral: Secondary | ICD-10-CM | POA: Diagnosis not present

## 2021-04-16 DIAGNOSIS — H04123 Dry eye syndrome of bilateral lacrimal glands: Secondary | ICD-10-CM | POA: Diagnosis not present

## 2021-05-22 NOTE — Progress Notes (Unsigned)
Referring Provider:Hawks, Theador Hawthorne, FNP Primary Care Physician:  Sharion Balloon, FNP Primary Gastroenterologist:  Dr. Abbey Chatters  No chief complaint on file.   HPI:   [name] is a 46 y.o. female presenting today at the request of Sharion Balloon, FNP for consult colonoscopy. Recommended OV due to history of chronic constipation and GERD. We last saw patient in 2017.   Today:     EGD in 2016 with normal esophagus, mild gastritis s/p biopsied (mild chronic gastritis, negative for H. Pylori), normal examined duodenum s/p biopsied (benign).  .CT abdomen and pelvis back in May 2016 for chronic right upper quadrant pain was unremarkable.  Past Medical History:  Diagnosis Date   Anemia    [facility]   Dysplasia of cervix, low grade (CIN 1)    History of chicken pox    Overactive bladder    MILD   Yeast vaginitis     Past Surgical History:  Procedure Laterality Date   CERVICAL CONIZATION W/BX  12/15/2010   Procedure: CONIZATION CERVIX WITH BIOPSY;  Surgeon: Delice Lesch, MD;  Location: [facility];  Service: Gynecology;  Laterality: N/A;   ESOPHAGOGASTRODUODENOSCOPY N/A 01/23/2015   SLF: abdominal pain due to GERD, gastririts, and IBS-C   LAPAROSCOPIC HYSTERECTOMY  11/30/2011   Procedure: HYSTERECTOMY TOTAL LAPAROSCOPIC;  Surgeon: Delice Lesch, MD;  Location: [facility];  Service: Gynecology;  Laterality: N/A;  with cysto   wisdom teethextraction      Current Outpatient Medications  Medication Sig Dispense Refill   Baclofen 5 MG TABS Take 5 mg by mouth 2 (two) times daily as needed. 90 tablet 2   buPROPion (WELLBUTRIN XL) 150 MG 24 hr tablet Take 1 tablet (150 mg total) by mouth daily. 90 tablet 2   busPIRone (BUSPAR) 5 MG tablet Take 1 tablet (5 mg total) by mouth 3 (three) times daily. 90 tablet 3   cetirizine (ZYRTEC) 10 MG tablet Take 1 tablet (10 mg total) by mouth daily. 90 tablet 1   fluticasone (FLONASE) 50 MCG/ACT  nasal spray Place 2 sprays into both nostrils daily. 16 g 6   Multiple Vitamin (MULTIVITAMIN) capsule Take 1 capsule by mouth daily.     No current facility-administered medications for this visit.    Allergies as of 05/24/2021   (No Known Allergies)    Family History  Problem Relation Age of Onset   Cancer Father        lung cancer, deceased   Cancer Mother        brain cancer, deceased   Heart disease Maternal Grandmother    Heart disease Paternal Grandfather        CHF    Diabetes Paternal Grandfather    Stroke Paternal Grandfather    Emphysema Paternal Grandmother    Diabetes Maternal Grandfather    Leukemia Paternal Uncle    Colon cancer Neg Hx     Social History   Socioeconomic History   Marital status: Married    Spouse name: Not on file   Number of children: 1   Years of education: Not on file   Highest education level: Not on file  Occupational History   Occupation: stay at home mom  Tobacco Use   Smoking status: Never   Smokeless tobacco: Never  Substance and Sexual Activity   Alcohol use: Yes    Alcohol/week: 1.0 standard drink    Types: 1 Standard drinks or equivalent per week  Comment: rare etoh use   Drug use: No   Sexual activity: Yes    Partners: Male    Comment: Hysterectomy  Other Topics Concern   Not on file  Social History Narrative   Not on file   Social Determinants of Health   Financial Resource Strain: Not on file  Food Insecurity: Not on file  Transportation Needs: Not on file  Physical Activity: Not on file  Stress: Not on file  Social Connections: Not on file  Intimate Partner Violence: Not on file    Review of Systems: Gen: Denies any fever, chills, fatigue, weight loss, lack of appetite.  CV: Denies chest pain, heart palpitations, peripheral edema, syncope.  Resp: Denies shortness of breath at rest or with exertion. Denies wheezing or cough.  GI: Denies dysphagia or odynophagia. Denies jaundice, hematemesis, fecal  incontinence. GU : Denies urinary burning, urinary frequency, urinary hesitancy MS: Denies joint pain, muscle weakness, cramps, or limitation of movement.  Derm: Denies rash, itching, dry skin Psych: Denies depression, anxiety, memory loss, and confusion Heme: Denies bruising, bleeding, and enlarged lymph nodes.  Physical Exam: LMP 11/27/2011  General:   Alert and oriented. Pleasant and cooperative. Well-nourished and well-developed.  Head:  Normocephalic and atraumatic. Eyes:  Without icterus, sclera clear and conjunctiva pink.  Ears:  Normal auditory acuity. Nose:  No deformity, discharge,  or lesions. Mouth:  No deformity or lesions, oral mucosa pink.  Neck:  Supple, without mass or thyromegaly. Lungs:  Clear to auscultation bilaterally. No wheezes, rales, or rhonchi. No distress.  Heart:  S1, S2 present without murmurs appreciated.  Abdomen:  +BS, soft, non-tender and non-distended. No HSM noted. No guarding or rebound. No masses appreciated.  Rectal:  Deferred  Msk:  Symmetrical without gross deformities. Normal posture. Pulses:  Normal pulses noted. Extremities:  Without clubbing or edema. Neurologic:  Alert and  oriented x4;  grossly normal neurologically. Skin:  Intact without significant lesions or rashes. Cervical Nodes:  No significant cervical adenopathy. Psych:  Alert and cooperative. Normal mood and affect.

## 2021-05-23 ENCOUNTER — Encounter (INDEPENDENT_AMBULATORY_CARE_PROVIDER_SITE_OTHER): Payer: Self-pay

## 2021-05-24 ENCOUNTER — Ambulatory Visit: Payer: BC Managed Care – PPO | Admitting: Gastroenterology

## 2021-05-24 NOTE — Progress Notes (Signed)
Primary Care Physician:  Sharion Balloon, FNP  Primary GI: Dr. Abbey Chatters  Patient Location: Home   Provider Location: [facility] office   Reason for Visit: Consult colonoscopy, follow-up chronic constipation and GERD.   Persons present on the virtual encounter, with roles: [name] (patient), Aliene Altes, PA-C (provider)   Total time (minutes) spent on medical discussion: 11 minutes  Virtual Visit via video Note Due to COVID-19, visit is conducted virtually and was requested by patient.   I connected with [name] on 05/25/21 at  9:30 AM EST by video and verified that I am speaking with the correct person using two identifiers.   I discussed the limitations, risks, security and privacy concerns of performing an evaluation and management service by video and the availability of in person appointments. I also discussed with the patient that there may be a patient responsible charge related to this service. The patient expressed understanding and agreed to proceed.  Chief Complaint  Patient presents with   Gastroesophageal Reflux    F/u. Doing fine   Constipation    Occas per pt     History of Present Illness: [name] is a 46 y.o. female presenting today at the request of Sharion Balloon, FNP for consult colonoscopy. Recommended OV due to history of chronic constipation and GERD. We last saw patient in 2017.  At that time, GERD was uncontrolled on omeprazole and she was switched to Dexilant 60 mg daily and later called to report Dexilant was working well.  Her constipation was well managed with OTC stool softeners as needed, Linzess not needed.    Today:  No prior colonoscopy.  No GI concerns.  Reports occasional constipation, but nothing routine.  As long as she watches what she eats, usually she has bowel movements every day.  She can go months without using a stool softener, but if she is constipated, stool softeners resolve her symptoms.  Denies BRBPR, melena,  unintentional weight loss.  Reports GERD symptoms completely resolved after discontinuing prescriptive medications.  Occasional indigestion, but this is infrequent.  Denies dysphagia, nausea, vomiting.    Past Medical History:  Diagnosis Date   Anemia    [facility]   Dysplasia of cervix, low grade (CIN 1)    History of chicken pox    Overactive bladder    MILD   Yeast vaginitis     Past Surgical History:  Procedure Laterality Date   CERVICAL CONIZATION W/BX  12/15/2010   Procedure: CONIZATION CERVIX WITH BIOPSY;  Surgeon: Delice Lesch, MD;  Location: [facility];  Service: Gynecology;  Laterality: N/A;   ESOPHAGOGASTRODUODENOSCOPY N/A 01/23/2015   SLF: normal esophagus, mild gastritis s/p biopsied (mild chronic gastritis, negative for H. Pylori), normal examined duodenum s/p biopsied (benign).  abdominal pain due to GERD, gastririts, and IBS-C   LAPAROSCOPIC HYSTERECTOMY  11/30/2011   Procedure: HYSTERECTOMY TOTAL LAPAROSCOPIC;  Surgeon: Delice Lesch, MD;  Location: [facility];  Service: Gynecology;  Laterality: N/A;  with cysto   wisdom teethextraction       Current Meds  Medication Sig   buPROPion (WELLBUTRIN XL) 150 MG 24 hr tablet Take 1 tablet (150 mg total) by mouth daily.   busPIRone (BUSPAR) 5 MG tablet Take 1 tablet (5 mg total) by mouth 3 (three) times daily. (Patient taking differently: Take 5 mg by mouth 3 (three) times daily. As needed)   fluticasone (FLONASE) 50 MCG/ACT nasal spray Place 2 sprays  into both nostrils daily. (Patient taking differently: Place 2 sprays into both nostrils daily. As needed)   Multiple Vitamin (MULTIVITAMIN) capsule Take 1 capsule by mouth daily.     Family History  Problem Relation Age of Onset   Cancer Father        lung cancer, deceased   Cancer Mother        brain cancer, deceased   Heart disease Maternal Grandmother    Heart disease Paternal Grandfather        CHF    Diabetes Paternal  Grandfather    Stroke Paternal Grandfather    Emphysema Paternal Grandmother    Diabetes Maternal Grandfather    Leukemia Paternal Uncle    Colon cancer Neg Hx     Social History   Socioeconomic History   Marital status: Married    Spouse name: Not on file   Number of children: 1   Years of education: Not on file   Highest education level: Not on file  Occupational History   Occupation: stay at home mom  Tobacco Use   Smoking status: Never   Smokeless tobacco: Never  Substance and Sexual Activity   Alcohol use: Yes    Alcohol/week: 1.0 standard drink    Types: 1 Standard drinks or equivalent per week    Comment: rare etoh use   Drug use: No   Sexual activity: Yes    Partners: Male    Comment: Hysterectomy  Other Topics Concern   Not on file  Social History Narrative   Not on file   Social Determinants of Health   Financial Resource Strain: Not on file  Food Insecurity: Not on file  Transportation Needs: Not on file  Physical Activity: Not on file  Stress: Not on file  Social Connections: Not on file       Review of Systems: Gen: Denies fever, chills, cold or flulike symptoms, presyncope, syncope. CV: Denies chest pain, palpitations. Resp: Denies dyspnea or cough. GI: see HPI Heme: See HPI  Observations/Objective: No distress. Alert and oriented. Pleasant. Well nourished. Normal mood and affect. Unable to perform complete physical exam due to video encounter.    Assessment:  46 year old female presenting today via virtual visit to discuss scheduling first-ever screening colonoscopy.  Clinically, she is doing very well and has no significant GI symptoms or alarm symptoms.  No family history of colon cancer.  She does have occasional constipation, but nothing routine, and this is well controlled with stool softeners as needed.  Previously with history of GERD, but symptoms resolved after discontinuing prescriptive medications.   Plan: Proceed with  colonoscopy with propofol with Dr. Abbey Chatters in the near future. The risks, benefits, and alternatives have been discussed with the patient in detail. The patient states understanding and desires to proceed. ASA 2 F/U PRN    I discussed the assessment and treatment plan with the patient. The patient was provided an opportunity to ask questions and all were answered. The patient agreed with the plan and demonstrated an understanding of the instructions.   The patient was advised to call back or seek an in-person evaluation if the symptoms worsen or if the condition fails to improve as anticipated.  I provided 11 minutes of video-face-to-face time during this encounter.  Aliene Altes, PA-C [facility]

## 2021-05-25 ENCOUNTER — Other Ambulatory Visit: Payer: Self-pay

## 2021-05-25 ENCOUNTER — Encounter: Payer: Self-pay | Admitting: Gastroenterology

## 2021-05-25 ENCOUNTER — Telehealth: Payer: Self-pay | Admitting: Gastroenterology

## 2021-05-25 ENCOUNTER — Telehealth (INDEPENDENT_AMBULATORY_CARE_PROVIDER_SITE_OTHER): Payer: BC Managed Care – PPO | Admitting: Gastroenterology

## 2021-05-25 ENCOUNTER — Telehealth: Payer: Self-pay | Admitting: *Deleted

## 2021-05-25 DIAGNOSIS — Z1211 Encounter for screening for malignant neoplasm of colon: Secondary | ICD-10-CM | POA: Diagnosis not present

## 2021-05-25 NOTE — Telephone Encounter (Signed)
[name], you are scheduled for a virtual visit with your provider today.  Just as we do with appointments in the office, we must obtain your consent to participate.  Your consent will be active for this visit and any virtual visit you may have with one of our providers in the next 365 days.  If you have a MyChart account, I can also send a copy of this consent to you electronically.  All virtual visits are billed to your insurance company just like a traditional visit in the office.  As this is a virtual visit, video technology does not allow for your provider to perform a traditional examination.  This may limit your provider's ability to fully assess your condition.  If your provider identifies any concerns that need to be evaluated in person or the need to arrange testing such as labs, EKG, etc, we will make arrangements to do so.  Although advances in technology are sophisticated, we cannot ensure that it will always work on either your end or our end.  If the connection with a video visit is poor, we may have to switch to a telephone visit.  With either a video or telephone visit, we are not always able to ensure that we have a secure connection.   I need to obtain your verbal consent now.   Are you willing to proceed with your visit today?

## 2021-05-25 NOTE — Telephone Encounter (Signed)
Called pt, LMOVM 

## 2021-05-25 NOTE — Patient Instructions (Addendum)
We we will arrange for you to have a colonoscopy in the near future with Dr. Abbey Chatters.  As we discussed, I recommend you reach out to your insurance company to confirm that they will cover a screening colonoscopy at age 46.  If they do not, please let us know.  It was a pleasure meeting you today!  I am glad you are doing well overall!  Aliene Altes, PA-C [facility]

## 2021-05-25 NOTE — Telephone Encounter (Signed)
Pt consented to a virtual visit. 

## 2021-05-25 NOTE — Telephone Encounter (Signed)
[facility] clinical pool: Please arrange colonoscopy with propofol with Dr. Abbey Chatters in the near future. ASA 2 Dx: Colon cancer screening

## 2021-05-26 NOTE — Telephone Encounter (Signed)
Letter mailed

## 2021-05-26 NOTE — Telephone Encounter (Signed)
LMOVM to call back 

## 2021-06-01 MED ORDER — PEG 3350-KCL-NA BICARB-NACL 420 G PO SOLR: ORAL | 0 refills | Status: DC

## 2021-06-01 NOTE — Addendum Note (Signed)
Addended by: Cheron Every on: 06/01/2021 09:41 AM   Modules accepted: Orders

## 2021-06-01 NOTE — Telephone Encounter (Signed)
Pt returned call. She has been scheduled for 3/21 at 11:00am. Aware will send rx to pharmacy. Will mail instructions.

## 2021-06-11 ENCOUNTER — Encounter: Payer: Self-pay | Admitting: Family Medicine

## 2021-06-11 ENCOUNTER — Ambulatory Visit: Payer: BC Managed Care – PPO | Admitting: Family Medicine

## 2021-06-11 VITALS — BP 132/77 | HR 82 | Temp 98.2°F | Ht 65.5 in | Wt 127.5 lb

## 2021-06-11 DIAGNOSIS — N898 Other specified noninflammatory disorders of vagina: Secondary | ICD-10-CM | POA: Diagnosis not present

## 2021-06-11 LAB — WET PREP FOR TRICH, YEAST, CLUE
Clue Cell Exam: NEGATIVE
Trichomonas Exam: NEGATIVE
Yeast Exam: NEGATIVE

## 2021-06-11 NOTE — Progress Notes (Signed)
? ?Subjective: ?CC: Vaginitis ?PCP: Jade Balloon, FNP ?[id number] [name] is a 46 y.o. female presenting to clinic today for: ? ?1.  Vaginal irritation ?Patient reports that she has been having some redness and irritation for about a week or so.  She is had this before but nobody is ever been able to determine what the etiology of these symptoms are.  She consistently tested negative for everything.  She has mentioned this to her OB/GYN but no etiology has been found.  She denies any vaginal discharge, odors or bleeding.  No concern for STI.  She has history of partial hysterectomy, sparing her ovaries.  She does report intermittent urinary leakage since her hysterectomy ? ? ?ROS: Per HPI ? ?No Known Allergies ?Past Medical History:  ?Diagnosis Date  ? Anemia   ? [facility]  ? Dysplasia of cervix, low grade (CIN 1)   ? History of chicken pox   ? Overactive bladder   ? MILD  ? Yeast vaginitis   ? ? ?Current Outpatient Medications:  ?  buPROPion (WELLBUTRIN XL) 150 MG 24 hr tablet, Take 1 tablet (150 mg total) by mouth daily., Disp: 90 tablet, Rfl: 2 ?  busPIRone (BUSPAR) 5 MG tablet, Take 1 tablet (5 mg total) by mouth 3 (three) times daily. (Patient taking differently: Take 5 mg by mouth 3 (three) times daily. As needed), Disp: 90 tablet, Rfl: 3 ?  fluticasone (FLONASE) 50 MCG/ACT nasal spray, Place 2 sprays into both nostrils daily. (Patient taking differently: Place 2 sprays into both nostrils daily. As needed), Disp: 16 g, Rfl: 6 ?  Multiple Vitamin (MULTIVITAMIN) capsule, Take 1 capsule by mouth daily., Disp: , Rfl:  ?  polyethylene glycol-electrolytes (NULYTELY) 420 g solution, As directed (Patient not taking: Reported on 06/11/2021), Disp: 4000 mL, Rfl: 0 ?Social History  ? ?Socioeconomic History  ? Marital status: Married  ?  Spouse name: Not on file  ? Number of children: 1  ? Years of education: Not on file  ? Highest education level: Not on file   ?Occupational History  ? Occupation: stay at home mom  ?Tobacco Use  ? Smoking status: Never  ? Smokeless tobacco: Never  ?Substance and Sexual Activity  ? Alcohol use: Yes  ?  Alcohol/week: 1.0 standard drink  ?  Types: 1 Standard drinks or equivalent per week  ?  Comment: rare etoh use  ? Drug use: No  ? Sexual activity: Yes  ?  Partners: Male  ?  Comment: Hysterectomy  ?Other Topics Concern  ? Not on file  ?Social History Narrative  ? Not on file  ? ?Social Determinants of Health  ? ?Financial Resource Strain: Not on file  ?Food Insecurity: Not on file  ?Transportation Needs: Not on file  ?Physical Activity: Not on file  ?Stress: Not on file  ?Social Connections: Not on file  ?Intimate Partner Violence: Not on file  ? ?Family History  ?Problem Relation Age of Onset  ? Cancer Father   ?     lung cancer, deceased  ? Cancer Mother   ?     brain cancer, deceased  ? Heart disease Maternal Grandmother   ? Heart disease Paternal Grandfather   ?     CHF   ? Diabetes Paternal Grandfather   ? Stroke Paternal Grandfather   ? Emphysema Paternal Grandmother   ? Diabetes Maternal Grandfather   ? Leukemia Paternal Uncle   ? Colon cancer Neg  Hx   ? ? ?Objective: ?Office vital signs reviewed. ?BP 132/77   Pulse 82   Temp 98.2 ?F (36.8 ?C) (Temporal)   Ht 5' 5.5" (1.664 m)   Wt 127 lb 8 oz (57.8 kg)   LMP 11/27/2011   BMI 20.89 kg/m?  ? ?Physical Examination:  ?General: Awake, alert, well nourished, No acute distress ?GU: external vaginal tissue with mild hyperemia but no skin breakdown or lesions, cervix surgically absent, mild white, clumped discharge noted in the vaginal vault.  No bleeding  ? ?Assessment/ Plan: ?46 y.o. female  ? ?Vaginal irritation - Plan: WET PREP FOR TRICH, YEAST, CLUE ? ?Wet prep with some bacteria and mild amounts of white blood cells but no clue cells nor any yeast.  Vaginal exam was remarkable for mild hyperemia only squamous cell aspect of the labia majora and mild hyperemia along the mucosal  aspect of the labia majora.  Nothing that would suggest a lichen sclerosus.  We discussed use of probiotics.  I gave her handout on healthy vaginal hygiene practices.  Would consider empiric boric acid vaginally if she continues to have refractory symptoms or recurrent symptoms despite conservative care.  Encouraged her to follow-up with OB/GYN if she continues to have symptoms. ? ?No orders of the defined types were placed in this encounter. ? ?No orders of the defined types were placed in this encounter. ? ? ? ?Jade Norlander, DO ?[place] ?[phone] ? ? ?

## 2021-06-11 NOTE — Patient Instructions (Addendum)
Negative for yeast but I think that a vaginal probiotic would be an excellent idea. ?If you continue to have symptoms, it may be worth pursuing vaginal boric acid supplements.  This balances vaginal flora. ? ?Healthy vaginal hygiene practices  ? ?-  Avoid sleeper pajamas. Nightgowns allow air to circulate.  Sleep without underpants whenever possible. ? ?-  Wear cotton underpants during the day. Double-rinse underwear after washing to avoid residual irritants. Do not use fabric softeners for underwear and swimsuits. ? ?- Avoid tights, leotards, leggings, "skinny" jeans, and other tight-fitting clothing. Skirts and loose-fitting pants allow air to circulate. ? ?- Avoid pantyliners.  Instead use tampons or cotton pads. ? ?- Daily warm bathing is helpful: ?    - Soak in clean water (no soap) for 10 to 15 minutes. Adding vinegar or baking soda to the water has not been specifically studied and may not be better than clean water alone.  ?    - Use soap to wash regions other than the genital area just before getting out of the tub. Limit use of any soap on genital areas. Use fragance-free soaps. ?    - Rinse the genital area well and gently pat dry.  Don't rub.  Hair dryer to assist with drying can be used only if on cool setting. ?    - Do not use bubble baths or perfumed soaps. ? ?- Do not use any feminine sprays, douches or powders.  These contain chemicals that will irritate the skin. ? ?- If the genital area is tender or swollen, cool compresses may relieve the discomfort. Unscented wet wipes can be used instead of toilet paper for wiping.  ? ?- Emollients, such as Vaseline, may help protect skin and can be applied to the irritated area. ? ?- Always remember to wipe front-to-back after bowel movements. Pat dry after urination. ? ?- Do not sit in wet swimsuits for long periods of time after swimming  ?

## 2021-06-14 IMAGING — MG MM BREAST LOCALIZATION CLIP
4 series · 4 of 12 positions shown · non-contrast
Comparison: Prior study

CLINICAL DATA: Evaluate COIL clip following stereotactic guided
RIGHT breast biopsy.

EXAM:
DIAGNOSTIC RIGHT MAMMOGRAM POST STEREOTACTIC BIOPSY

[R CC synth-2D]
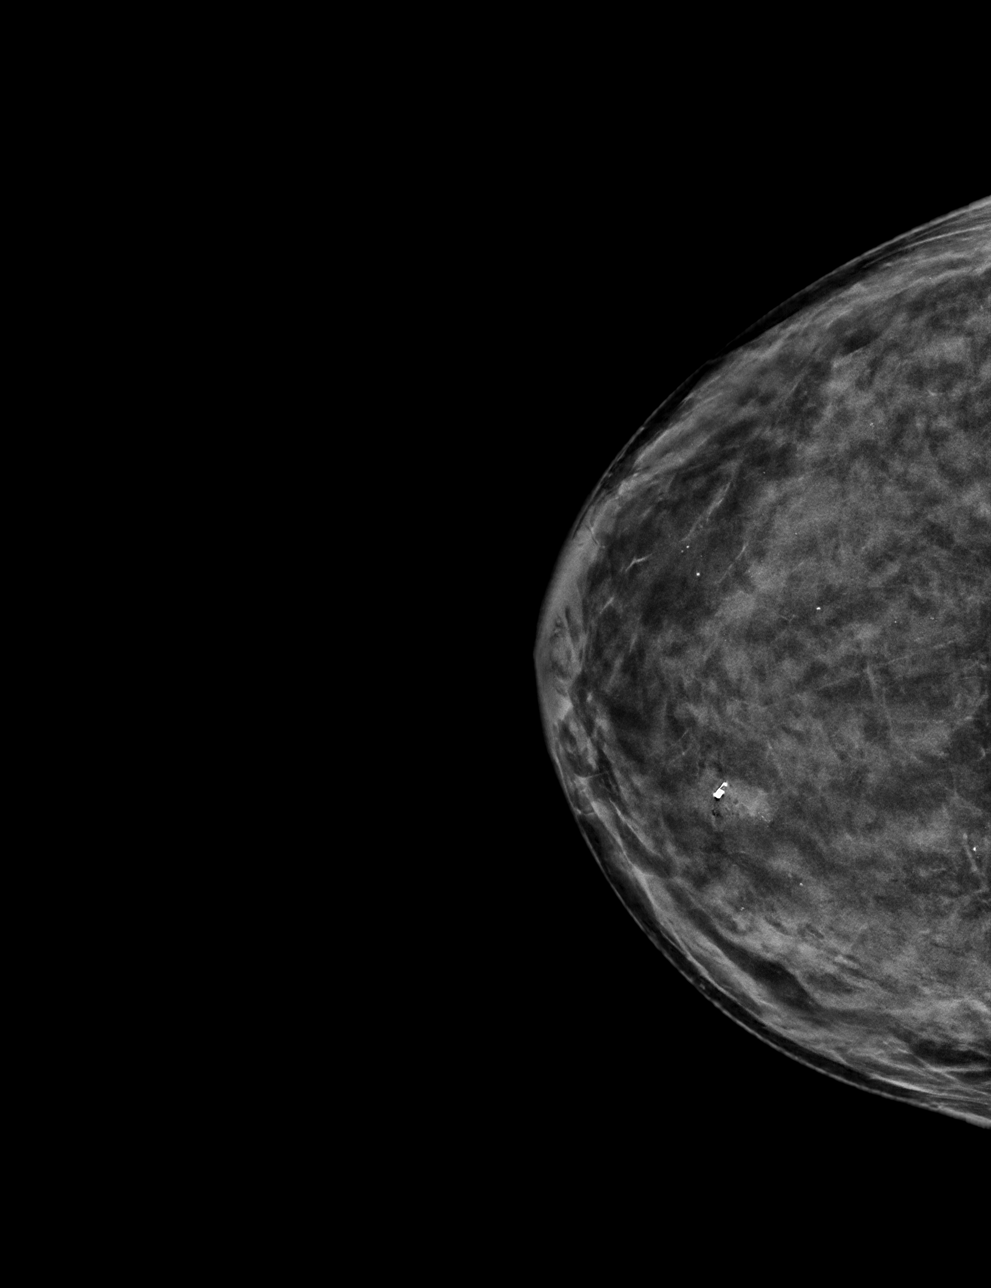

[R ML synth-2D]
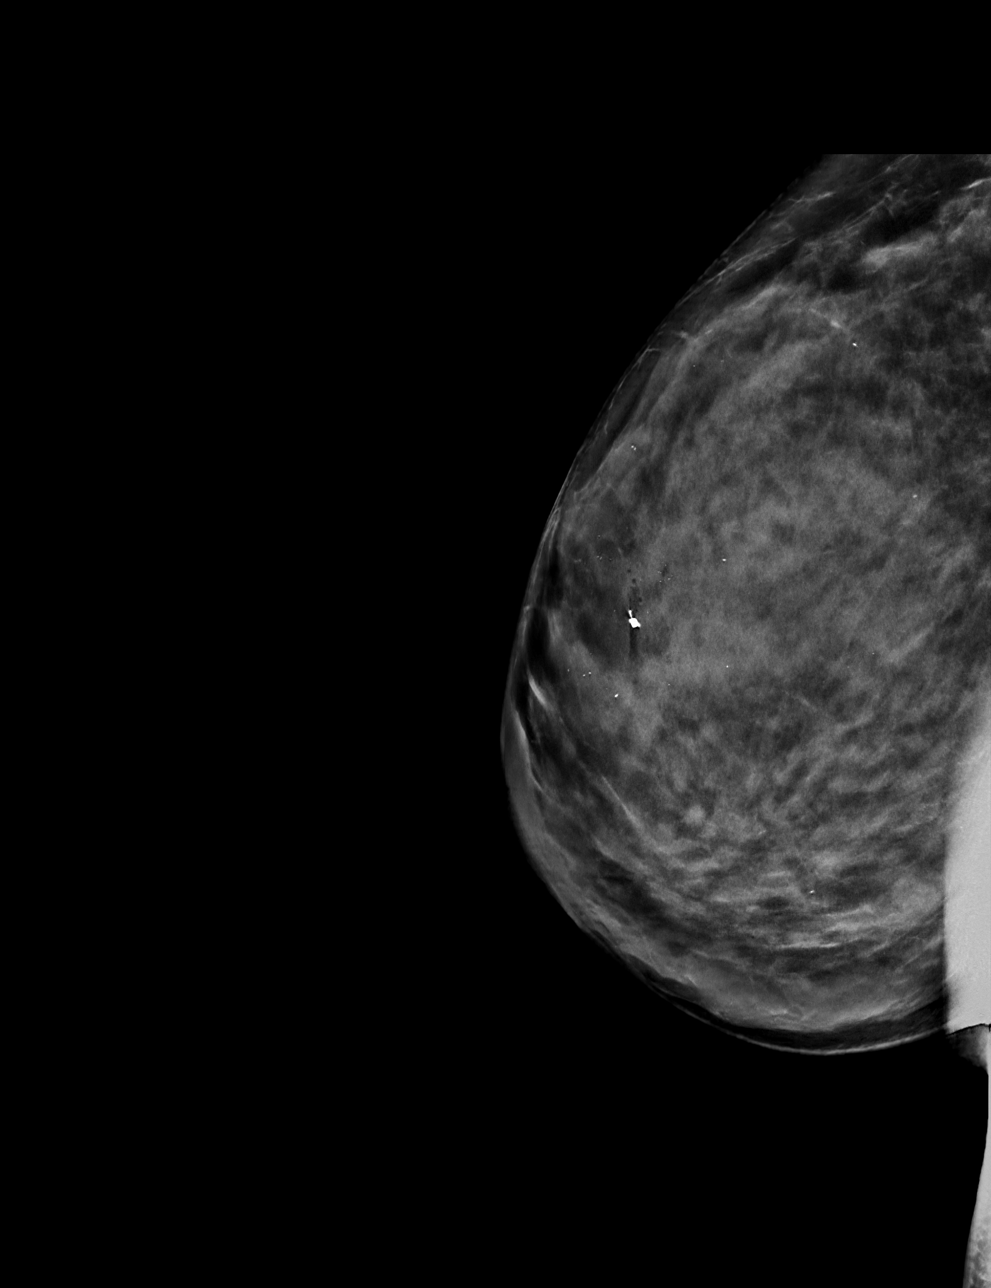

[R CC tomo · tomo slice 37/74.0]
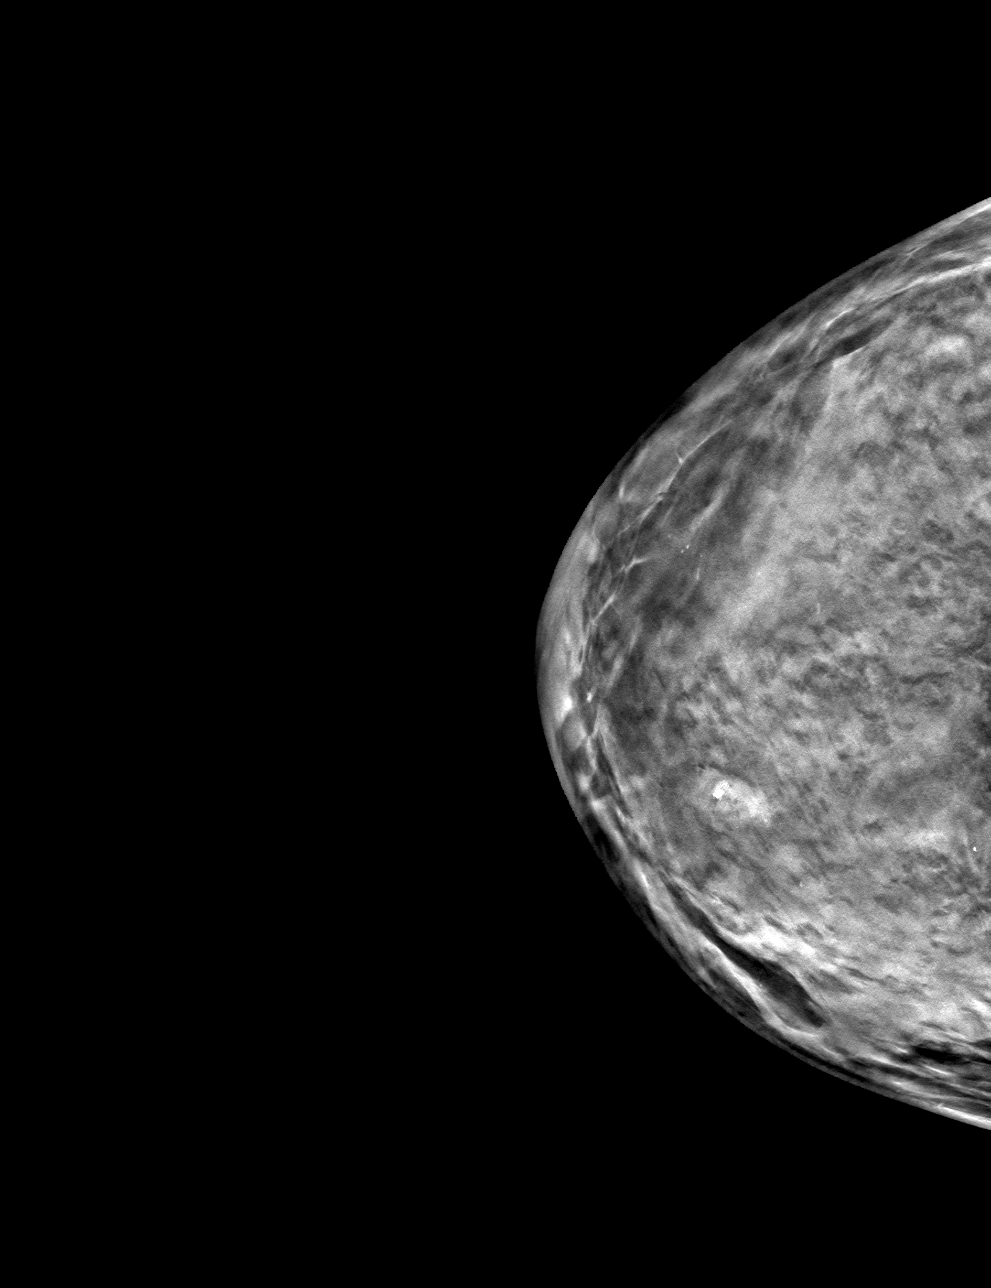

[R ML tomo · tomo slice 39/76.0]
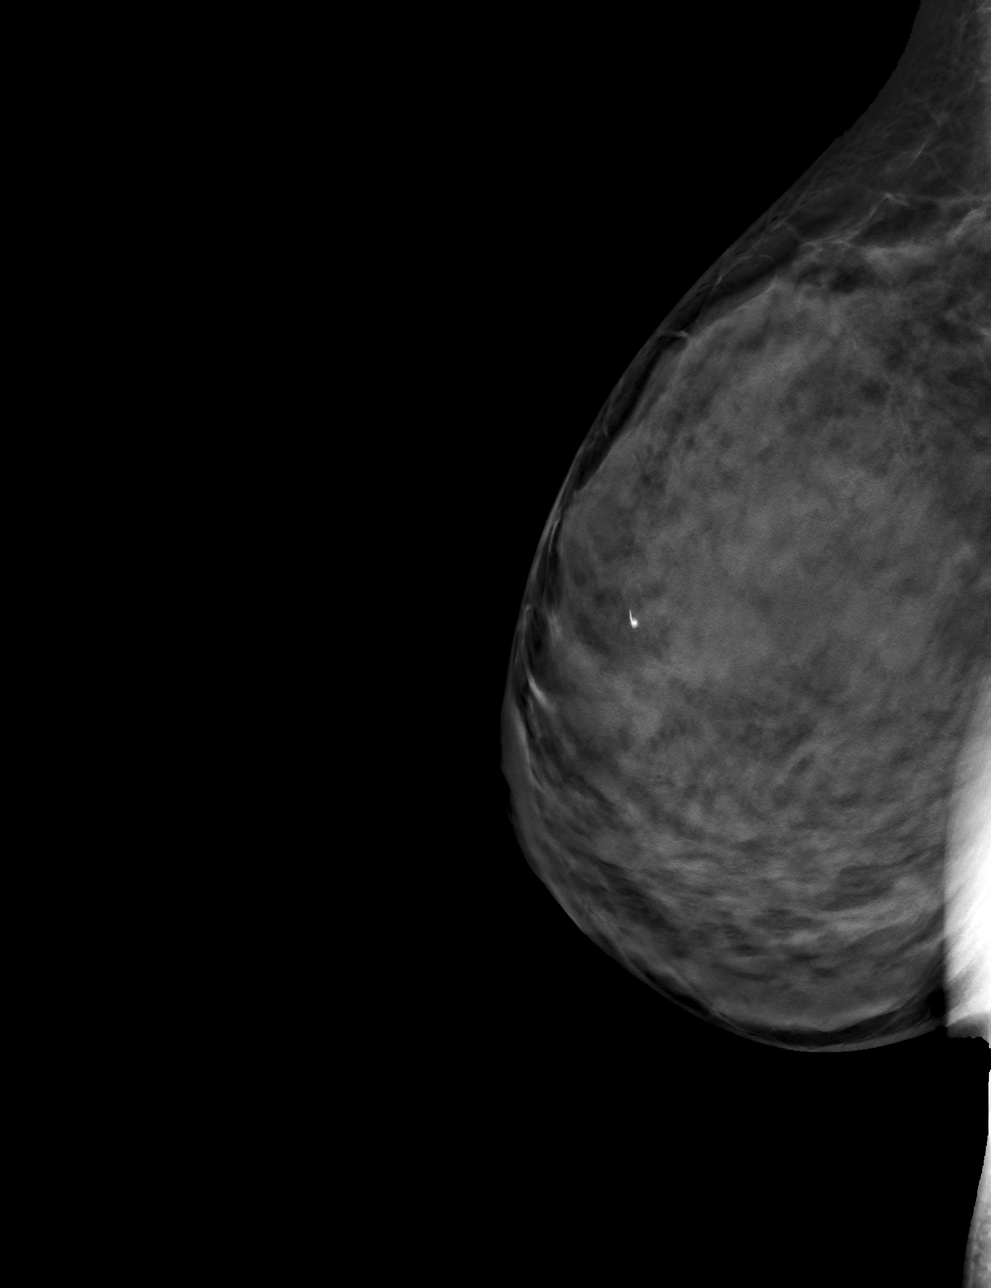

[4 of 12 positions shown; findings below may reference images not displayed]

FINDINGS: Mammographic images were obtained following stereotactic guided
biopsy of 0.6 cm group of calcifications within the UPPER INNER
RIGHT breast.

The COIL biopsy marking clip lies 0.5 cm anterior to residual
calcifications.
IMPRESSION: COIL shaped biopsy marking clip at the site of biopsy in the UPPER
INNER RIGHT breast, appropriately positioned and lies 0.5 cm
anterior to residual calcifications.

Final Assessment: Post Procedure Mammograms for Marker Placement

## 2021-06-22 ENCOUNTER — Encounter (HOSPITAL_COMMUNITY): Payer: Self-pay

## 2021-06-22 ENCOUNTER — Encounter (HOSPITAL_COMMUNITY)
Admission: RE | Disposition: A | Discharge status: Home / Self Care | Payer: Self-pay | Source: Home / Self Care | Attending: Internal Medicine

## 2021-06-22 ENCOUNTER — Ambulatory Visit (HOSPITAL_COMMUNITY): Payer: BC Managed Care – PPO | Admitting: Anesthesiology

## 2021-06-22 ENCOUNTER — Other Ambulatory Visit: Payer: Self-pay

## 2021-06-22 ENCOUNTER — Ambulatory Visit (HOSPITAL_COMMUNITY)
Admission: RE | Admit: 2021-06-22 | Discharge: 2021-06-22 | Disposition: A | Discharge status: Home / Self Care | Payer: BC Managed Care – PPO | Attending: Internal Medicine | Admitting: Internal Medicine

## 2021-06-22 DIAGNOSIS — K648 Other hemorrhoids: Secondary | ICD-10-CM | POA: Diagnosis not present

## 2021-06-22 DIAGNOSIS — F419 Anxiety disorder, unspecified: Secondary | ICD-10-CM | POA: Insufficient documentation

## 2021-06-22 DIAGNOSIS — Z139 Encounter for screening, unspecified: Secondary | ICD-10-CM | POA: Diagnosis not present

## 2021-06-22 DIAGNOSIS — K635 Polyp of colon: Secondary | ICD-10-CM

## 2021-06-22 DIAGNOSIS — Z1211 Encounter for screening for malignant neoplasm of colon: Secondary | ICD-10-CM | POA: Diagnosis not present

## 2021-06-22 DIAGNOSIS — D123 Benign neoplasm of transverse colon: Secondary | ICD-10-CM | POA: Insufficient documentation

## 2021-06-22 DIAGNOSIS — E039 Hypothyroidism, unspecified: Secondary | ICD-10-CM | POA: Diagnosis not present

## 2021-06-22 HISTORY — PX: POLYPECTOMY: SHX5525

## 2021-06-22 HISTORY — PX: COLONOSCOPY WITH PROPOFOL: SHX5780

## 2021-06-22 SURGERY — COLONOSCOPY WITH PROPOFOL
Anesthesia: General

## 2021-06-22 MED ORDER — PROPOFOL 10 MG/ML IV BOLUS
INTRAVENOUS | Status: DC | PRN
  Administered 2021-06-22: 150 mg via INTRAVENOUS

## 2021-06-22 MED ORDER — LACTATED RINGERS IV SOLN: INTRAVENOUS | Status: DC

## 2021-06-22 MED ORDER — LIDOCAINE HCL (CARDIAC) PF 50 MG/5ML IV SOSY
PREFILLED_SYRINGE | INTRAVENOUS | Status: DC | PRN
Start: 2021-06-22 — End: 2021-06-22
  Administered 2021-06-22: 50 mg via INTRAVENOUS

## 2021-06-22 NOTE — Anesthesia Postprocedure Evaluation (Signed)
Anesthesia Post Note ? ?Patient: TREMEKA HELBLING ? ?Procedure(s) Performed: COLONOSCOPY WITH PROPOFOL ?POLYPECTOMY ? ?Patient location during evaluation: Phase II ?Anesthesia Type: General ?Level of consciousness: awake and alert and oriented ?Pain management: pain level controlled ?Vital Signs Assessment: post-procedure vital signs reviewed and stable ?Respiratory status: spontaneous breathing, nonlabored ventilation and respiratory function stable ?Cardiovascular status: blood pressure returned to baseline and stable ?Postop Assessment: no apparent nausea or vomiting ?Anesthetic complications: no ? ? ?No notable events documented. ? ? ?Last Vitals:  ?Vitals:  ? 06/22/21 1021 06/22/21 1152  ?BP: 117/75 (!) 93/54  ?Pulse: 72 80  ?Resp: 14 15  ?Temp: 36.7 ?C 36.4 ?C  ?SpO2: 100% 100%  ?  ?Last Pain:  ?Vitals:  ? 06/22/21 1152  ?TempSrc: Oral  ?PainSc: 0-No pain  ? ? ?  ?  ?  ?  ?  ?  ? ?Perri Lamagna C Lasaundra Riche ? ? ? ? ?

## 2021-06-22 NOTE — Discharge Instructions (Addendum)
?  Colonoscopy ?Discharge Instructions ? ?Read the instructions outlined below and refer to this sheet in the next few weeks. These discharge instructions provide you with general information on caring for yourself after you leave the hospital. Your doctor may also give you specific instructions. While your treatment has been planned according to the most current medical practices available, unavoidable complications occasionally occur.  ? ?ACTIVITY ?You may resume your regular activity, but move at a slower pace for the next 24 hours.  ?Take frequent rest periods for the next 24 hours.  ?Walking will help get rid of the air and reduce the bloated feeling in your belly (abdomen).  ?No driving for 24 hours (because of the medicine (anesthesia) used during the test).   ?Do not sign any important legal documents or operate any machinery for 24 hours (because of the anesthesia used during the test).  ?NUTRITION ?Drink plenty of fluids.  ?You may resume your normal diet as instructed by your doctor.  ?Begin with a light meal and progress to your normal diet. Heavy or fried foods are harder to digest and may make you feel sick to your stomach (nauseated).  ?Avoid alcoholic beverages for 24 hours or as instructed.  ?MEDICATIONS ?You may resume your normal medications unless your doctor tells you otherwise.  ?WHAT YOU CAN EXPECT TODAY ?Some feelings of bloating in the abdomen.  ?Passage of more gas than usual.  ?Spotting of blood in your stool or on the toilet paper.  ?IF YOU HAD POLYPS REMOVED DURING THE COLONOSCOPY: ?No aspirin products for 7 days or as instructed.  ?No alcohol for 7 days or as instructed.  ?Eat a soft diet for the next 24 hours.  ?FINDING OUT THE RESULTS OF YOUR TEST ?Not all test results are available during your visit. If your test results are not back during the visit, make an appointment with your caregiver to find out the results. Do not assume everything is normal if you have not heard from your  caregiver or the medical facility. It is important for you to follow up on all of your test results.  ?SEEK IMMEDIATE MEDICAL ATTENTION IF: ?You have more than a spotting of blood in your stool.  ?Your belly is swollen (abdominal distention).  ?You are nauseated or vomiting.  ?You have a temperature over 101.  ?You have abdominal pain or discomfort that is severe or gets worse throughout the day.  ? ?Your colonoscopy revealed 1 polyp(s) which I removed successfully. Await pathology results, my office will contact you. I recommend repeating colonoscopy in 5-10 years for surveillance purposes depending on pathology results. Otherwise follow up with Gi as needed.  ? ? ?I hope you have a great rest of your week! ? ?Elon Alas. Abbey Chatters, D.O. ?Gastroenterology and Hepatology ?[facility] ? ? ? ?

## 2021-06-22 NOTE — Transfer of Care (Signed)
Immediate Anesthesia Transfer of Care Note ? ?Patient: [name] ? ?Procedure(s) Performed: COLONOSCOPY WITH PROPOFOL ?POLYPECTOMY ? ?Patient Location: Endoscopy Unit ? ?Anesthesia Type:General ? ?Level of Consciousness: awake and patient cooperative ? ?Airway & Oxygen Therapy: Patient Spontanous Breathing ? ?Post-op Assessment: Report given to RN and Post -op Vital signs reviewed and stable ? ?Post vital signs: Reviewed and stable ? ?Last Vitals:  ?Vitals Value Taken Time  ?BP    ?Temp    ?Pulse    ?Resp    ?SpO2    ?SEE PACU FLOW SHEET FOR VITAL SIGNS  ? ?Last Pain:  ?Vitals:  ? 06/22/21 1134  ?TempSrc:   ?PainSc: 0-No pain  ?   ? ?Patients Stated Pain Goal: 5 (06/22/21 1021) ? ?Complications: No notable events documented. ?

## 2021-06-22 NOTE — Anesthesia Preprocedure Evaluation (Addendum)
Anesthesia Evaluation  ?Patient identified by MRN, date of birth, ID band ?Patient awake ? ? ? ?Reviewed: ?Allergy & Precautions, NPO status , Patient's Chart, lab work & pertinent test results ? ?Airway ?Mallampati: II ? ?TM Distance: >3 FB ?Neck ROM: Full ? ? ? Dental ? ?(+) Dental Advisory Given, Teeth Intact ?  ?Pulmonary ?neg pulmonary ROS,  ?  ?Pulmonary exam normal ?breath sounds clear to auscultation ? ? ? ? ? ? Cardiovascular ?negative cardio ROS ?Normal cardiovascular exam ?Rhythm:Regular Rate:Normal ? ? ?  ?Neuro/Psych ?PSYCHIATRIC DISORDERS Anxiety negative neurological ROS ?   ? GI/Hepatic ?Neg liver ROS, GERD  Controlled,  ?Endo/Other  ?Hypothyroidism  ? Renal/GU ?negative Renal ROS  ?negative genitourinary ?  ?Musculoskeletal ?negative musculoskeletal ROS ?(+)  ? Abdominal ?  ?Peds ?negative pediatric ROS ?(+)  Hematology ? ?(+) Blood dyscrasia, anemia ,   ?Anesthesia Other Findings ? ? Reproductive/Obstetrics ?negative OB ROS ? ?  ? ? ? ? ? ? ? ? ? ? ? ? ? ?  ?  ? ? ? ? ? ? ?Anesthesia Physical ?Anesthesia Plan ? ?ASA: 2 ? ?Anesthesia Plan: General  ? ?Post-op Pain Management: Minimal or no pain anticipated  ? ?Induction: Intravenous ? ?PONV Risk Score and Plan: TIVA ? ?Airway Management Planned: Nasal Cannula and Natural Airway ? ?Additional Equipment:  ? ?Intra-op Plan:  ? ?Post-operative Plan:  ? ?Informed Consent: I have reviewed the patients History and Physical, chart, labs and discussed the procedure including the risks, benefits and alternatives for the proposed anesthesia with the patient or authorized representative who has indicated his/her understanding and acceptance.  ? ? ? ?Dental advisory given ? ?Plan Discussed with: CRNA and Surgeon ? ?Anesthesia Plan Comments:   ? ? ? ? ? ? ?Anesthesia Quick Evaluation ? ?

## 2021-06-22 NOTE — Op Note (Signed)
[facility] ?Patient Name: [name] ?Procedure Date: 06/22/2021 11:27 AM ?MRN: [id number] ?Date of Birth: 1975-12-05 ?Attending MD: Elon Alas. Abbey Chatters , DO ?CSN: [id number] ?Age: 47 ?Admit Type: Outpatient ?Procedure:                Colonoscopy ?Indications:              Screening for colorectal malignant neoplasm ?Providers:                Elon Alas. Abbey Chatters, DO, Janeece Riggers, RN, Tammy  ?                          Vaught, RN, Minette Headland L. Risa Grill, Technician ?Referring MD:              ?Medicines:                See the Anesthesia note for documentation of the  ?                          administered medications ?Complications:            No immediate complications. ?Estimated Blood Loss:     Estimated blood loss was minimal. ?Procedure:                Pre-Anesthesia Assessment: ?                          - The anesthesia plan was to use monitored  ?                          anesthesia care (MAC). ?                          After obtaining informed consent, the colonoscope  ?                          was passed under direct vision. Throughout the  ?                          procedure, the patient's blood pressure, pulse, and  ?                          oxygen saturations were monitored continuously. The  ?                          PCF-HQ190L ([id number]) scope was introduced through  ?                          the anus and advanced to the the cecum, identified  ?                          by appendiceal orifice and ileocecal valve. The  ?                          colonoscopy was performed without difficulty. The  ?                          patient  tolerated the procedure well. The quality  ?                          of the bowel preparation was evaluated using the  ?                          BBPS [facility] Bowel Preparation Scale) with scores  ?                          of: Right Colon = 3, Transverse Colon = 3 and Left  ?                          Colon = 3 (entire mucosa seen well with no residual  ?                           staining, small fragments of stool or opaque  ?                          liquid). The total BBPS score equals 9. ?Scope In: 11:35:24 AM ?Scope Out: 11:47:52 AM ?Scope Withdrawal Time: 0 hours 8 minutes 13 seconds  ?Total Procedure Duration: 0 hours 12 minutes 28 seconds  ?Findings: ?     The perianal and digital rectal examinations were normal. ?     Non-bleeding internal hemorrhoids were found during endoscopy. ?     A 4 mm polyp was found in the transverse colon. The polyp was sessile.  ?     The polyp was removed with a cold snare. Resection and retrieval were  ?     complete. ?     The exam was otherwise without abnormality. ?Impression:               - Non-bleeding internal hemorrhoids. ?                          - One 4 mm polyp in the transverse colon, removed  ?                          with a cold snare. Resected and retrieved. ?                          - The examination was otherwise normal. ?Moderate Sedation: ?     Per Anesthesia Care ?Recommendation:           - Patient has a contact number available for  ?                          emergencies. The signs and symptoms of potential  ?                          delayed complications were discussed with the  ?                          patient. Return to normal activities tomorrow.  ?  Written discharge instructions were provided to the  ?                          patient. ?                          - Resume previous diet. ?                          - Continue present medications. ?                          - Await pathology results. ?                          - Repeat colonoscopy in 5-10 years for surveillance. ?                          - Return to GI clinic PRN. ?Procedure Code(s):        --- Professional --- ?                          [phone], Colonoscopy, flexible; with removal of  ?                          tumor(s), polyp(s), or other lesion(s) by snare  ?                          technique ?Diagnosis Code(s):         --- Professional --- ?                          Z12.11, Encounter for screening for malignant  ?                          neoplasm of colon ?                          K63.5, Polyp of colon ?                          K64.8, Other hemorrhoids ?CPT copyright 2019 American Medical Association. All rights reserved. ?The codes documented in this report are preliminary and upon coder review may  ?be revised to meet current compliance requirements. ?Elon Alas. Abbey Chatters, DO ?Elon Alas. [place], DO ?06/22/2021 11:52:00 AM ?This report has been signed electronically. ?Number of Addenda: 0 ?

## 2021-06-22 NOTE — Interval H&P Note (Signed)
History and Physical Interval Note: ? ?06/22/2021 ?11:22 AM ? ?[name]  has presented today for surgery, with the diagnosis of screening.  The various methods of treatment have been discussed with the patient and family. After consideration of risks, benefits and other options for treatment, the patient has consented to  Procedure(s) with comments: ?COLONOSCOPY WITH PROPOFOL (N/A) - 11:00am, asa 2 as a surgical intervention.  The patient's history has been reviewed, patient examined, no change in status, stable for surgery.  I have reviewed the patient's chart and labs.  Questions were answered to the patient's satisfaction.   ? ? ?Eloise Harman ? ? ?

## 2021-06-22 NOTE — Anesthesia Procedure Notes (Signed)
Date/Time: 06/22/2021 11:30 AM ?Performed by: Vista Deck, CRNA ?Pre-anesthesia Checklist: Patient identified, Emergency Drugs available, Suction available, Timeout performed and Patient being monitored ?Patient Re-evaluated:Patient Re-evaluated prior to induction ?Oxygen Delivery Method: Nasal Cannula ? ? ? ? ?

## 2021-06-23 LAB — SURGICAL PATHOLOGY

## 2021-06-24 ENCOUNTER — Encounter (HOSPITAL_COMMUNITY): Payer: Self-pay | Admitting: Internal Medicine

## 2021-09-14 DIAGNOSIS — D069 Carcinoma in situ of cervix, unspecified: Secondary | ICD-10-CM | POA: Diagnosis not present

## 2021-09-14 DIAGNOSIS — Z1231 Encounter for screening mammogram for malignant neoplasm of breast: Secondary | ICD-10-CM | POA: Diagnosis not present

## 2021-09-14 DIAGNOSIS — N898 Other specified noninflammatory disorders of vagina: Secondary | ICD-10-CM | POA: Diagnosis not present

## 2021-09-14 DIAGNOSIS — Z01419 Encounter for gynecological examination (general) (routine) without abnormal findings: Secondary | ICD-10-CM | POA: Diagnosis not present

## 2021-11-22 ENCOUNTER — Ambulatory Visit: Payer: BC Managed Care – PPO | Admitting: Family

## 2021-11-22 ENCOUNTER — Encounter: Payer: Self-pay | Admitting: Family

## 2021-11-22 VITALS — BP 107/70 | HR 58 | Temp 97.9°F | Ht 65.5 in | Wt 125.2 lb

## 2021-11-22 DIAGNOSIS — Z0001 Encounter for general adult medical examination with abnormal findings: Secondary | ICD-10-CM | POA: Diagnosis not present

## 2021-11-22 DIAGNOSIS — Z0000 Encounter for general adult medical examination without abnormal findings: Secondary | ICD-10-CM

## 2021-11-22 DIAGNOSIS — Z136 Encounter for screening for cardiovascular disorders: Secondary | ICD-10-CM | POA: Diagnosis not present

## 2021-11-22 DIAGNOSIS — F411 Generalized anxiety disorder: Secondary | ICD-10-CM

## 2021-11-22 DIAGNOSIS — R002 Palpitations: Secondary | ICD-10-CM | POA: Diagnosis not present

## 2021-11-22 MED ORDER — BUPROPION HCL ER (XL) 150 MG PO TB24: 150.0000 mg | ORAL_TABLET | Freq: Every day | ORAL | 2 refills | Status: DC

## 2021-11-22 MED ORDER — BUPROPION HCL ER (XL) 150 MG PO TB24: 150.0000 mg | ORAL_TABLET | Freq: Every day | ORAL | 4 refills | Status: DC

## 2021-11-22 NOTE — Progress Notes (Signed)
Subjective:    Patient ID: [name], female    DOB: Jul 03, 1975, 46 y.o.   MRN: [id number]  Chief Complaint  Patient presents with   Medical Management of Chronic Issues   Pt presents to the office today for CPE without pap and chronic follow up. Pt has seen Cardiologists in the past for palpitations with a negative Holter monitor and ECHO.    She is followed by GYN annually. Had colonoscopy completed on 06/22/21. Anxiety Presents for follow-up visit. Symptoms include excessive worry and restlessness. Patient reports no irritability. Symptoms occur occasionally. The severity of symptoms is mild. The quality of sleep is good.    Depression        This is a chronic problem.  The current episode started more than 1 year ago.   The onset quality is gradual.   The problem occurs intermittently.  Associated symptoms include restlessness.  Associated symptoms include no helplessness, no hopelessness, no appetite change and not sad.  Past medical history includes anxiety.       Review of Systems  Constitutional:  Negative for appetite change and irritability.  Psychiatric/Behavioral:  Positive for depression.   All other systems reviewed and are negative.  Family History  Problem Relation Age of Onset   Cancer Father        lung cancer, deceased   Cancer Mother        brain cancer, deceased   Heart disease Maternal Grandmother    Heart disease Paternal Grandfather        CHF    Diabetes Paternal Grandfather    Stroke Paternal Grandfather    Emphysema Paternal Grandmother    Diabetes Maternal Grandfather    Leukemia Paternal Uncle    Colon cancer Neg Hx    Social History   Socioeconomic History   Marital status: Married    Spouse name: Not on file   Number of children: 1   Years of education: Not on file   Highest education level: Not on file  Occupational History   Occupation: stay at home mom  Tobacco Use   Smoking status: Never   Smokeless tobacco: Never   Substance and Sexual Activity   Alcohol use: Yes    Alcohol/week: 1.0 standard drink of alcohol    Types: 1 Standard drinks or equivalent per week    Comment: rare etoh use   Drug use: No   Sexual activity: Yes    Partners: Male    Comment: Hysterectomy  Other Topics Concern   Not on file  Social History Narrative   Not on file   Social Determinants of Health   Financial Resource Strain: Not on file  Food Insecurity: Not on file  Transportation Needs: Not on file  Physical Activity: Not on file  Stress: Not on file  Social Connections: Not on file       Objective:   Physical Exam Vitals reviewed.  Constitutional:      General: She is not in acute distress.    Appearance: She is well-developed.  HENT:     Head: Normocephalic and atraumatic.     Right Ear: Tympanic membrane normal.     Left Ear: Tympanic membrane normal.  Eyes:     Pupils: Pupils are equal, round, and reactive to light.  Neck:     Thyroid: No thyromegaly.  Cardiovascular:     Rate and Rhythm: Normal rate and regular rhythm.     Heart sounds: Normal heart sounds. No  murmur heard. Pulmonary:     Effort: Pulmonary effort is normal. No respiratory distress.     Breath sounds: Normal breath sounds. No wheezing.  Abdominal:     General: Bowel sounds are normal. There is no distension.     Palpations: Abdomen is soft.     Tenderness: There is no abdominal tenderness.  Musculoskeletal:        General: No tenderness. Normal range of motion.     Cervical back: Normal range of motion and neck supple.  Skin:    General: Skin is warm and dry.  Neurological:     Mental Status: She is alert and oriented to person, place, and time.     Cranial Nerves: No cranial nerve deficit.     Deep Tendon Reflexes: Reflexes are normal and symmetric.  Psychiatric:        Behavior: Behavior normal.        Thought Content: Thought content normal.        Judgment: Judgment normal.      BP 107/70   Pulse (!) 58    Temp 97.9 F (36.6 C) (Temporal)   Ht 5' 5.5" (1.664 m)   Wt 125 lb 3.2 oz (56.8 kg)   LMP 11/27/2011   BMI 20.52 kg/m       Assessment & Plan:  [name] comes in today with chief complaint of Medical Management of Chronic Issues   Diagnosis and orders addressed:  1. GAD (generalized anxiety disorder) - buPROPion (WELLBUTRIN XL) 150 MG 24 hr tablet; Take 1 tablet (150 mg total) by mouth daily.  Dispense: 90 tablet; Refill: 2 - CMP14+EGFR - CBC with Differential/Platelet  2. Annual physical exam - CMP14+EGFR - CBC with Differential/Platelet - Lipid panel - TSH  3. Palpitations - CMP14+EGFR - CBC with Differential/Platelet   Labs pending Health Maintenance reviewed Diet and exercise encouraged  Follow up plan: 1 year   Jade Dun, FNP

## 2021-11-22 NOTE — Patient Instructions (Signed)

## 2021-11-23 LAB — CMP14+EGFR
ALT: 8 IU/L (ref 0–32)
AST: 15 IU/L (ref 0–40)
Albumin/Globulin Ratio: 2 (ref 1.2–2.2)
Albumin: 4.4 g/dL (ref 3.9–4.9)
Alkaline Phosphatase: 60 IU/L (ref 44–121)
BUN/Creatinine Ratio: 13 (ref 9–23)
BUN: 10 mg/dL (ref 6–24)
Bilirubin Total: 0.5 mg/dL (ref 0.0–1.2)
CO2: 23 mmol/L (ref 20–29)
Calcium: 9.6 mg/dL (ref 8.7–10.2)
Chloride: 104 mmol/L (ref 96–106)
Creatinine, Ser: 0.79 mg/dL (ref 0.57–1.00)
Globulin, Total: 2.2 g/dL (ref 1.5–4.5)
Glucose: 84 mg/dL (ref 70–99)
Potassium: 4.1 mmol/L (ref 3.5–5.2)
Sodium: 138 mmol/L (ref 134–144)
Total Protein: 6.6 g/dL (ref 6.0–8.5)
eGFR: 93 mL/min/{1.73_m2} (ref >59)

## 2021-11-23 LAB — TSH: TSH: 3.99 u[IU]/mL (ref 0.450–4.500)

## 2021-11-23 LAB — CBC WITH DIFFERENTIAL/PLATELET
Basophils Absolute: 0 10*3/uL (ref 0.0–0.2)
Basos: 1 %
EOS (ABSOLUTE): 0 10*3/uL (ref 0.0–0.4)
Eos: 1 %
Hematocrit: 41.9 % (ref 34.0–46.6)
Hemoglobin: 14 g/dL (ref 11.1–15.9)
Immature Grans (Abs): 0 10*3/uL (ref 0.0–0.1)
Immature Granulocytes: 0 %
Lymphocytes Absolute: 1.2 10*3/uL (ref 0.7–3.1)
Lymphs: 25 %
MCH: 31.4 pg (ref 26.6–33.0)
MCHC: 33.4 g/dL (ref 31.5–35.7)
MCV: 94 fL (ref 79–97)
Monocytes Absolute: 0.4 10*3/uL (ref 0.1–0.9)
Monocytes: 8 %
Neutrophils Absolute: 3.1 10*3/uL (ref 1.4–7.0)
Neutrophils: 65 %
Platelets: 211 10*3/uL (ref 150–450)
RBC: 4.46 x10E6/uL (ref 3.77–5.28)
RDW: 12.9 % (ref 11.7–15.4)
WBC: 4.8 10*3/uL (ref 3.4–10.8)

## 2021-11-23 LAB — LIPID PANEL
Chol/HDL Ratio: 3.7 ratio (ref 0.0–4.4)
Cholesterol, Total: 188 mg/dL (ref 100–199)
HDL: 51 mg/dL (ref >39)
LDL Chol Calc (NIH): 115 mg/dL — ABNORMAL HIGH (ref 0–99)
Triglycerides: 123 mg/dL (ref 0–149)
VLDL Cholesterol Cal: 22 mg/dL (ref 5–40)

## 2022-01-04 DIAGNOSIS — E559 Vitamin D deficiency, unspecified: Secondary | ICD-10-CM | POA: Diagnosis not present

## 2022-01-04 DIAGNOSIS — Z01419 Encounter for gynecological examination (general) (routine) without abnormal findings: Secondary | ICD-10-CM | POA: Diagnosis not present

## 2022-01-04 DIAGNOSIS — N941 Unspecified dyspareunia: Secondary | ICD-10-CM | POA: Diagnosis not present

## 2022-01-04 DIAGNOSIS — N760 Acute vaginitis: Secondary | ICD-10-CM | POA: Diagnosis not present

## 2022-01-04 DIAGNOSIS — N898 Other specified noninflammatory disorders of vagina: Secondary | ICD-10-CM | POA: Diagnosis not present

## 2022-02-09 DIAGNOSIS — Z23 Encounter for immunization: Secondary | ICD-10-CM | POA: Diagnosis not present

## 2022-06-29 DIAGNOSIS — B3731 Acute candidiasis of vulva and vagina: Secondary | ICD-10-CM | POA: Diagnosis not present

## 2022-06-29 DIAGNOSIS — N898 Other specified noninflammatory disorders of vagina: Secondary | ICD-10-CM | POA: Diagnosis not present

## 2022-06-29 DIAGNOSIS — N951 Menopausal and female climacteric states: Secondary | ICD-10-CM | POA: Diagnosis not present

## 2022-09-29 DIAGNOSIS — D069 Carcinoma in situ of cervix, unspecified: Secondary | ICD-10-CM | POA: Diagnosis not present

## 2022-09-29 DIAGNOSIS — Z01419 Encounter for gynecological examination (general) (routine) without abnormal findings: Secondary | ICD-10-CM | POA: Diagnosis not present

## 2022-09-29 DIAGNOSIS — Z1231 Encounter for screening mammogram for malignant neoplasm of breast: Secondary | ICD-10-CM | POA: Diagnosis not present

## 2022-10-12 NOTE — Progress Notes (Unsigned)
Referring Provider: Junie Spencer, FNP Primary Care Physician:  Junie Spencer, FNP Primary GI Physician: Dr. Marletta Lor  No chief complaint on file.   HPI:   [name] is a 47 y.o. female with history of constipation, GERD, presenting today with chief complaint of constipation ***  Last seen via virtual visit 05/25/2021 for scheduling colonoscopy.  She denied any GI concerns.  Stated as long as she watched what she ate, she would usually have a bowel movement every day.  She could go months without using a stool softener.  No alarm symptoms.  GERD had resolved after discontinuing prescription medications.  Noted occasional indigestion, but nothing routinely.  Colonoscopy was completed 06/22/2021 with nonbleeding internal hemorrhoids, one 4 mm polyp in the transverse colon resected and retrieved.  Pathology was sessile serrated polyp.  Recommended 5-year recall.   Past Medical History:  Diagnosis Date   Anemia    [facility]   Dysplasia of cervix, low grade (CIN 1)    History of chicken pox    Overactive bladder    MILD   Yeast vaginitis     Past Surgical History:  Procedure Laterality Date   CERVICAL CONIZATION W/BX  12/15/2010   Procedure: CONIZATION CERVIX WITH BIOPSY;  Surgeon: Purcell Nails, MD;  Location: [facility];  Service: Gynecology;  Laterality: N/A;   COLONOSCOPY WITH PROPOFOL N/A 06/22/2021   Procedure: COLONOSCOPY WITH PROPOFOL;  Surgeon: Lanelle Bal, DO;  Location: [facility];  Service: Endoscopy;  Laterality: N/A;  11:00am, asa 2   ESOPHAGOGASTRODUODENOSCOPY N/A 01/23/2015   SLF: normal esophagus, mild gastritis s/p biopsied (mild chronic gastritis, negative for H. Pylori), normal examined duodenum s/p biopsied (benign).  abdominal pain due to GERD, gastririts, and IBS-C   LAPAROSCOPIC HYSTERECTOMY  11/30/2011   Procedure: HYSTERECTOMY TOTAL LAPAROSCOPIC;  Surgeon: Purcell Nails, MD;  Location: [facility];   Service: Gynecology;  Laterality: N/A;  with cysto   POLYPECTOMY  06/22/2021   Procedure: POLYPECTOMY;  Surgeon: Lanelle Bal, DO;  Location: [facility];  Service: Endoscopy;;   wisdom teethextraction      Current Outpatient Medications  Medication Sig Dispense Refill   buPROPion (WELLBUTRIN XL) 150 MG 24 hr tablet Take 1 tablet (150 mg total) by mouth daily. 90 tablet 4   No current facility-administered medications for this visit.    Allergies as of 10/13/2022   (No Known Allergies)    Family History  Problem Relation Age of Onset   Cancer Father        lung cancer, deceased   Cancer Mother        brain cancer, deceased   Heart disease Maternal Grandmother    Heart disease Paternal Grandfather        CHF    Diabetes Paternal Grandfather    Stroke Paternal Grandfather    Emphysema Paternal Grandmother    Diabetes Maternal Grandfather    Leukemia Paternal Uncle    Colon cancer Neg Hx     Social History   Socioeconomic History   Marital status: Married    Spouse name: Not on file   Number of children: 1   Years of education: Not on file   Highest education level: Not on file  Occupational History   Occupation: stay at home mom  Tobacco Use   Smoking status: Never   Smokeless tobacco: Never  Substance and Sexual Activity   Alcohol use: Yes  Alcohol/week: 1.0 standard drink of alcohol    Types: 1 Standard drinks or equivalent per week    Comment: rare etoh use   Drug use: No   Sexual activity: Yes    Partners: Male    Comment: Hysterectomy  Other Topics Concern   Not on file  Social History Narrative   Not on file   Social Determinants of Health   Financial Resource Strain: Not on file  Food Insecurity: Not on file  Transportation Needs: Not on file  Physical Activity: Not on file  Stress: Not on file  Social Connections: Not on file    Review of Systems: Gen: Denies fever, chills, anorexia. Denies fatigue, weakness, weight loss.  CV:  Denies chest pain, palpitations, syncope, peripheral edema, and claudication. Resp: Denies dyspnea at rest, cough, wheezing, coughing up blood, and pleurisy. GI: Denies vomiting blood, jaundice, and fecal incontinence.   Denies dysphagia or odynophagia. Derm: Denies rash, itching, dry skin Psych: Denies depression, anxiety, memory loss, confusion. No homicidal or suicidal ideation.  Heme: Denies bruising, bleeding, and enlarged lymph nodes.  Physical Exam: LMP 11/27/2011  General:   Alert and oriented. No distress noted. Pleasant and cooperative.  Head:  Normocephalic and atraumatic. Eyes:  Conjuctiva clear without scleral icterus. Heart:  S1, S2 present without murmurs appreciated. Lungs:  Clear to auscultation bilaterally. No wheezes, rales, or rhonchi. No distress.  Abdomen:  +BS, soft, non-tender and non-distended. No rebound or guarding. No HSM or masses noted. Msk:  Symmetrical without gross deformities. Normal posture. Extremities:  Without edema. Neurologic:  Alert and  oriented x4 Psych:  Normal mood and affect.    Assessment:     Plan:  ***   Ermalinda Memos, PA-C [facility] 10/13/2022

## 2022-10-13 ENCOUNTER — Ambulatory Visit (INDEPENDENT_AMBULATORY_CARE_PROVIDER_SITE_OTHER): Payer: BC Managed Care – PPO | Admitting: Gastroenterology

## 2022-10-13 ENCOUNTER — Encounter: Payer: Self-pay | Admitting: Gastroenterology

## 2022-10-13 VITALS — BP 127/82 | HR 76 | Temp 98.0°F | Ht 66.0 in | Wt 126.2 lb

## 2022-10-13 DIAGNOSIS — K5904 Chronic idiopathic constipation: Secondary | ICD-10-CM

## 2022-10-13 NOTE — Patient Instructions (Signed)
Have your thyroid function checked at [facility].  You can discuss possibility of changing wellbutrin with your PCP   Start MiraLAX 17 g daily in 8 ounces of water or other noncarbonated beverage of your choice.  You can call in a couple weeks to let me know if this is not working for you.  We can plan to follow-up in the office in about 3 months.  It was good to see you today!  Ermalinda Memos, PA-C [facility]

## 2022-10-20 ENCOUNTER — Other Ambulatory Visit: Payer: BC Managed Care – PPO

## 2022-10-20 DIAGNOSIS — K5904 Chronic idiopathic constipation: Secondary | ICD-10-CM | POA: Diagnosis not present

## 2022-10-21 LAB — TSH: TSH: 5.55 u[IU]/mL — ABNORMAL HIGH (ref 0.450–4.500)

## 2022-11-11 ENCOUNTER — Other Ambulatory Visit: Payer: Self-pay | Admitting: Family

## 2022-11-11 DIAGNOSIS — F411 Generalized anxiety disorder: Secondary | ICD-10-CM

## 2022-11-11 NOTE — Telephone Encounter (Signed)
Appt made for 12-06-22 for one year physical.

## 2022-11-11 NOTE — Telephone Encounter (Signed)
[facility] NT[facility] Last PE 11/22/21 30 days sent to pharmacy

## 2022-12-03 ENCOUNTER — Other Ambulatory Visit: Payer: Self-pay | Admitting: Family

## 2022-12-03 DIAGNOSIS — F411 Generalized anxiety disorder: Secondary | ICD-10-CM

## 2022-12-06 ENCOUNTER — Encounter: Payer: Self-pay | Admitting: Family

## 2022-12-06 ENCOUNTER — Ambulatory Visit (INDEPENDENT_AMBULATORY_CARE_PROVIDER_SITE_OTHER): Payer: BC Managed Care – PPO | Admitting: Family

## 2022-12-06 VITALS — BP 115/71 | HR 78 | Temp 98.1°F | Ht 66.0 in | Wt 126.4 lb

## 2022-12-06 DIAGNOSIS — E039 Hypothyroidism, unspecified: Secondary | ICD-10-CM

## 2022-12-06 DIAGNOSIS — K5900 Constipation, unspecified: Secondary | ICD-10-CM

## 2022-12-06 DIAGNOSIS — F411 Generalized anxiety disorder: Secondary | ICD-10-CM | POA: Diagnosis not present

## 2022-12-06 DIAGNOSIS — Z0001 Encounter for general adult medical examination with abnormal findings: Secondary | ICD-10-CM

## 2022-12-06 DIAGNOSIS — Z0000 Encounter for general adult medical examination without abnormal findings: Secondary | ICD-10-CM

## 2022-12-06 MED ORDER — BUPROPION HCL ER (XL) 150 MG PO TB24: 150.0000 mg | ORAL_TABLET | Freq: Every day | ORAL | 2 refills | Status: DC

## 2022-12-06 MED ORDER — BUPROPION HCL ER (XL) 150 MG PO TB24: 150.0000 mg | ORAL_TABLET | Freq: Every day | ORAL | 4 refills | Status: DC

## 2022-12-06 NOTE — Patient Instructions (Signed)

## 2022-12-06 NOTE — Progress Notes (Signed)
Subjective:    Patient ID: [name], female    DOB: [birth year], 47 y.o.   MRN: [id number]  Chief Complaint  Patient presents with   Annual Exam   Pt presents to the office today for CPE without pap and chronic follow up. Pt has seen Cardiologists in the past for palpitations with a negative Holter monitor and ECHO.    She is followed by GYN annually. Had colonoscopy completed on 06/22/21. Anxiety Presents for follow-up visit. Symptoms include excessive worry, nervous/anxious behavior and restlessness. Symptoms occur occasionally. The severity of symptoms is moderate.    Thyroid Problem Presents for follow-up visit. Symptoms include anxiety, constipation and fatigue. The symptoms have been stable.      Review of Systems  Constitutional:  Positive for fatigue.  Gastrointestinal:  Positive for constipation.  Psychiatric/Behavioral:  The patient is nervous/anxious.   All other systems reviewed and are negative.  Family History  Problem Relation Age of Onset   Cancer Father        lung cancer, deceased   Cancer Mother        brain cancer, deceased   Heart disease Maternal Grandmother    Heart disease Paternal Grandfather        CHF    Diabetes Paternal Grandfather    Stroke Paternal Grandfather    Emphysema Paternal Grandmother    Diabetes Maternal Grandfather    Leukemia Paternal Uncle    Colon cancer Neg Hx    Social History   Socioeconomic History   Marital status: Married    Spouse name: Not on file   Number of children: 1   Years of education: Not on file   Highest education level: Not on file  Occupational History   Occupation: stay at home mom  Tobacco Use   Smoking status: Never   Smokeless tobacco: Never  Substance and Sexual Activity   Alcohol use: Yes    Alcohol/week: 1.0 standard drink of alcohol    Types: 1 Standard drinks or equivalent per week    Comment: rare etoh use   Drug use: No   Sexual activity: Yes    Partners: Male     Comment: Hysterectomy  Other Topics Concern   Not on file  Social History Narrative   Not on file   Social Determinants of Health   Financial Resource Strain: Not on file  Food Insecurity: Not on file  Transportation Needs: Not on file  Physical Activity: Not on file  Stress: Not on file  Social Connections: Not on file       Objective:   Physical Exam Vitals reviewed.  Constitutional:      General: She is not in acute distress.    Appearance: She is well-developed.  HENT:     Head: Normocephalic and atraumatic.     Right Ear: Tympanic membrane normal.     Left Ear: Tympanic membrane normal.  Eyes:     Pupils: Pupils are equal, round, and reactive to light.  Neck:     Thyroid: No thyromegaly.  Cardiovascular:     Rate and Rhythm: Normal rate and regular rhythm.     Heart sounds: Normal heart sounds. No murmur heard. Pulmonary:     Effort: Pulmonary effort is normal. No respiratory distress.     Breath sounds: Normal breath sounds. No wheezing.  Abdominal:     General: Bowel sounds are normal. There is no distension.     Palpations: Abdomen is soft.  Tenderness: There is no abdominal tenderness.  Musculoskeletal:        General: No tenderness. Normal range of motion.     Cervical back: Normal range of motion and neck supple.  Skin:    General: Skin is warm and dry.  Neurological:     Mental Status: She is alert and oriented to person, place, and time.     Cranial Nerves: No cranial nerve deficit.     Deep Tendon Reflexes: Reflexes are normal and symmetric.  Psychiatric:        Behavior: Behavior normal.        Thought Content: Thought content normal.        Judgment: Judgment normal.     BP 115/71   Pulse 78   Temp 98.1 F (36.7 C) (Temporal)   Ht 5\' 6"  (1.676 m)   Wt 126 lb 6.4 oz (57.3 kg)   LMP 11/27/2011   SpO2 98%   BMI 20.40 kg/m       Assessment & Plan:  [name] comes in today with chief complaint of Annual Exam   Diagnosis  and orders addressed:  1. GAD (generalized anxiety disorder) - CMP14+EGFR - CBC with Differential/Platelet - buPROPion (WELLBUTRIN XL) 150 MG 24 hr tablet; Take 1 tablet (150 mg total) by mouth daily.  Dispense: 90 tablet; Refill: 4  2. Annual physical exam - CMP14+EGFR - CBC with Differential/Platelet - Lipid panel - TSH  3. Hypothyroidism, unspecified type - CMP14+EGFR - CBC with Differential/Platelet - TSH  4. Constipation, unspecified constipation type - CMP14+EGFR - CBC with Differential/Platelet   Labs pending Continue current medications TSH abnormal from GI, pending and if abnormal will add levothryoxine Health Maintenance reviewed Diet and exercise encouraged  Follow up plan: 1 year    Jannifer Rodney, FNP

## 2022-12-07 LAB — LIPID PANEL
Chol/HDL Ratio: 4.1 ratio (ref 0.0–4.4)
Cholesterol, Total: 230 mg/dL — ABNORMAL HIGH (ref 100–199)
HDL: 56 mg/dL (ref >39)
LDL Chol Calc (NIH): 146 mg/dL — ABNORMAL HIGH (ref 0–99)
Triglycerides: 154 mg/dL — ABNORMAL HIGH (ref 0–149)
VLDL Cholesterol Cal: 28 mg/dL (ref 5–40)

## 2022-12-07 LAB — CMP14+EGFR
ALT: 16 IU/L (ref 0–32)
AST: 22 IU/L (ref 0–40)
Albumin: 4.5 g/dL (ref 3.9–4.9)
Alkaline Phosphatase: 63 IU/L (ref 44–121)
BUN/Creatinine Ratio: 10 (ref 9–23)
BUN: 9 mg/dL (ref 6–24)
Bilirubin Total: <0.2 mg/dL (ref 0.0–1.2)
CO2: 24 mmol/L (ref 20–29)
Calcium: 9.6 mg/dL (ref 8.7–10.2)
Chloride: 102 mmol/L (ref 96–106)
Creatinine, Ser: 0.86 mg/dL (ref 0.57–1.00)
Globulin, Total: 2.3 g/dL (ref 1.5–4.5)
Glucose: 84 mg/dL (ref 70–99)
Potassium: 4.1 mmol/L (ref 3.5–5.2)
Sodium: 139 mmol/L (ref 134–144)
Total Protein: 6.8 g/dL (ref 6.0–8.5)
eGFR: 83 mL/min/{1.73_m2} (ref >59)

## 2022-12-07 LAB — TSH: TSH: 5.26 u[IU]/mL — ABNORMAL HIGH (ref 0.450–4.500)

## 2022-12-07 LAB — CBC WITH DIFFERENTIAL/PLATELET
Basophils Absolute: 0 10*3/uL (ref 0.0–0.2)
Basos: 1 %
EOS (ABSOLUTE): 0 10*3/uL (ref 0.0–0.4)
Eos: 0 %
Hematocrit: 42.3 % (ref 34.0–46.6)
Hemoglobin: 13.9 g/dL (ref 11.1–15.9)
Immature Grans (Abs): 0 10*3/uL (ref 0.0–0.1)
Immature Granulocytes: 1 %
Lymphocytes Absolute: 1.4 10*3/uL (ref 0.7–3.1)
Lymphs: 26 %
MCH: 31.1 pg (ref 26.6–33.0)
MCHC: 32.9 g/dL (ref 31.5–35.7)
MCV: 95 fL (ref 79–97)
Monocytes Absolute: 0.4 10*3/uL (ref 0.1–0.9)
Monocytes: 7 %
Neutrophils Absolute: 3.5 10*3/uL (ref 1.4–7.0)
Neutrophils: 65 %
Platelets: 229 10*3/uL (ref 150–450)
RBC: 4.47 x10E6/uL (ref 3.77–5.28)
RDW: 12.7 % (ref 11.7–15.4)
WBC: 5.3 10*3/uL (ref 3.4–10.8)

## 2022-12-08 ENCOUNTER — Other Ambulatory Visit: Payer: Self-pay | Admitting: Family

## 2022-12-08 MED ORDER — LEVOTHYROXINE SODIUM 25 MCG PO TABS: 25.0000 ug | ORAL_TABLET | Freq: Every day | ORAL | 1 refills | Status: DC

## 2023-02-07 ENCOUNTER — Ambulatory Visit: Payer: BC Managed Care – PPO | Admitting: Family

## 2023-02-07 ENCOUNTER — Other Ambulatory Visit: Payer: Self-pay | Admitting: Family Medicine

## 2023-02-07 ENCOUNTER — Other Ambulatory Visit: Payer: BC Managed Care – PPO

## 2023-02-07 DIAGNOSIS — E039 Hypothyroidism, unspecified: Secondary | ICD-10-CM | POA: Diagnosis not present

## 2023-02-08 LAB — TSH: TSH: 4.46 u[IU]/mL (ref 0.450–4.500)

## 2023-02-27 ENCOUNTER — Other Ambulatory Visit: Payer: Self-pay | Admitting: Family

## 2023-02-27 DIAGNOSIS — F411 Generalized anxiety disorder: Secondary | ICD-10-CM

## 2023-02-27 NOTE — Telephone Encounter (Signed)
Copied from CRM [id number]. Topic: Clinical - Medication Refill >> Feb 27, 2023  9:06 AM Dondra Prader A wrote: Most Recent Primary Care Visit:  Provider: [facility]  Department: [facility]  Visit Type: LAB  Date: 02/07/2023  Medication: buPROPion (WELLBUTRIN XL) 150 MG 24 hr tablet   Has the patient contacted their pharmacy? Yes (Agent: If no, request that the patient contact the pharmacy for the refill. If patient does not wish to contact the pharmacy document the reason why and proceed with request.) (Agent: If yes, when and what did the pharmacy advise?)  Is this the correct pharmacy for this prescription? Yes If no, delete pharmacy and type the correct one.  This is the patient's preferred pharmacy:  [id number] - [place], [place] - [address] [place]  [id number] Phone: [phone] Fax: [phone]   Has the prescription been filled recently? No  Is the patient out of the medication? No  Has the patient been seen for an appointment in the last year OR does the patient have an upcoming appointment? Yes  Can we respond through MyChart? No  Agent: Please be advised that Rx refills may take up to 3 business days. We ask that you follow-up with your pharmacy.

## 2023-03-01 ENCOUNTER — Telehealth: Payer: Self-pay | Admitting: Family Medicine

## 2023-03-01 DIAGNOSIS — F411 Generalized anxiety disorder: Secondary | ICD-10-CM

## 2023-03-01 MED ORDER — BUPROPION HCL ER (XL) 150 MG PO TB24: 150.0000 mg | ORAL_TABLET | Freq: Every day | ORAL | 1 refills | Status: DC

## 2023-03-01 NOTE — Telephone Encounter (Signed)
Copied from CRM [id number]. Topic: Clinical - Prescription Issue >> Mar 01, 2023 10:05 AM Dennison Nancy wrote: Reason for CRM: Patient called on 03/01/23 checking on status of medication refill for the  buPROPion (WELLBUTRIN XL) 150 MG 24 hr tablet , Patient reached out to the pharmacy and they do not have the prescription yet, patient has 2 days left and will be out of the medication during the holidays  . Patient callback #  [phone] . Per office Jannifer Rodney been out of the office and other providers are taking care of medication refill request. Patient has concern if not having the medication the effects it may cause due to can't go cold [place] .

## 2023-03-01 NOTE — Telephone Encounter (Signed)
Sent in refills patient AWARE

## 2023-03-09 ENCOUNTER — Encounter: Payer: Self-pay | Admitting: Gastroenterology

## 2023-04-10 NOTE — Progress Notes (Signed)
 Referring Provider: Lavell Bari LABOR, FNP Primary Care Physician:  Lavell Bari LABOR, FNP Primary GI Physician: Dr. Cindie  Chief Complaint  Patient presents with   Follow-up    Follow up. Having cramps in her bottom sometimes.     HPI:   [name] is a 48 y.o. female with history of constipation, GERD, presenting today for follow-up of constipation.   Last seen in the office 10/13/2022.  Reported chronic intermittent constipation.  Had taken stool softeners, but they stopped helping about 1.5 years ago.  Had also tried probiotics, fiber supplements.  She was drinking plenty of water , exercising regularly, following a healthy diet, eating plenty of fruits and vegetables.  She was taking a super cleanse, 1/2 pill daily and was having a bowel movement most days.  No alarm symptoms.  Plan to check thyroid  function and start MiraLAX daily.  Queried whether Wellbutrin  was contributing to worsening constipation and that she could discuss this with her primary care doctor.  TSH was elevated at 5.55.  Recommended following up with PCP.  She has since been started on levothyroxine , TSH improved 4.46 on 11/5.   Today: Intermittent cramping in her very low pelvic/buttock area. Not right at the anal opening,feels like it is deeper inside. Feels pain radiate almost across her mid buttock. Last seconds to minutes. Doesn't occur with a bowel movement or urination. Occurs at random. No specific trigger. For example, last night, she was just laying on the couch watching TV.   Not exactly sure when the symptoms started. Could have started months ago as symptoms were very infrequent, and she didn't think much of it. Since around October, has been a little more frequent, occurring at least once a week.   In general constipation has improved with MiraLAX and she is having a bowel movement most every day.    No abnormal vaginal bleeding/discharge.    Colonoscopy 06/22/2021 with nonbleeding internal  hemorrhoids, one 4 mm polyp in the transverse colon resected and retrieved. Pathology was sessile serrated polyp. Recommended 5-year recall.   Past Medical History:  Diagnosis Date   Anemia    [facility]   Dysplasia of cervix, low grade (CIN 1)    History of chicken pox    Overactive bladder    MILD   Yeast vaginitis     Past Surgical History:  Procedure Laterality Date   CERVICAL CONIZATION W/BX  12/15/2010   Procedure: CONIZATION CERVIX WITH BIOPSY;  Surgeon: Jon CINDERELLA Rummer, MD;  Location: [facility];  Service: Gynecology;  Laterality: N/A;   COLONOSCOPY WITH PROPOFOL  N/A 06/22/2021   Procedure: COLONOSCOPY WITH PROPOFOL ;  Surgeon: Cindie Carlin POUR, DO;  Location: [facility];  Service: Endoscopy;  Laterality: N/A;  11:00am, asa 2   ESOPHAGOGASTRODUODENOSCOPY N/A 01/23/2015   SLF: normal esophagus, mild gastritis s/p biopsied (mild chronic gastritis, negative for H. Pylori), normal examined duodenum s/p biopsied (benign).  abdominal pain due to GERD, gastririts, and IBS-C   LAPAROSCOPIC HYSTERECTOMY  11/30/2011   Procedure: HYSTERECTOMY TOTAL LAPAROSCOPIC;  Surgeon: Jon CINDERELLA Rummer, MD;  Location: [facility];  Service: Gynecology;  Laterality: N/A;  with cysto   POLYPECTOMY  06/22/2021   Procedure: POLYPECTOMY;  Surgeon: Cindie Carlin POUR, DO;  Location: [facility];  Service: Endoscopy;;   wisdom teethextraction      Current Outpatient Medications  Medication Sig Dispense Refill   buPROPion  (WELLBUTRIN  XL) 150 MG 24 hr tablet Take 1 tablet (  150 mg total) by mouth daily. 90 tablet 1   levothyroxine  (SYNTHROID ) 25 MCG tablet Take 1 tablet (25 mcg total) by mouth daily before breakfast. (Patient not taking: Reported on 04/12/2023) 90 tablet 1   No current facility-administered medications for this visit.    Allergies as of 04/12/2023   (No Known Allergies)    Family History  Problem Relation Age of Onset   Cancer Father        lung  cancer, deceased   Cancer Mother        brain cancer, deceased   Heart disease Maternal Grandmother    Heart disease Paternal Grandfather        CHF    Diabetes Paternal Grandfather    Stroke Paternal Grandfather    Emphysema Paternal Grandmother    Diabetes Maternal Grandfather    Leukemia Paternal Uncle    Colon cancer Neg Hx     Social History   Socioeconomic History   Marital status: Married    Spouse name: Not on file   Number of children: 1   Years of education: Not on file   Highest education level: Not on file  Occupational History   Occupation: stay at home mom  Tobacco Use   Smoking status: Never   Smokeless tobacco: Never  Substance and Sexual Activity   Alcohol use: Yes    Alcohol/week: 1.0 standard drink of alcohol    Types: 1 Standard drinks or equivalent per week    Comment: rare etoh use   Drug use: No   Sexual activity: Yes    Partners: Male    Comment: Hysterectomy  Other Topics Concern   Not on file  Social History Narrative   Not on file   Social Drivers of Health   Financial Resource Strain: Not on file  Food Insecurity: Not on file  Transportation Needs: Not on file  Physical Activity: Not on file  Stress: Not on file  Social Connections: Not on file    Review of Systems: Gen: Denies fever, chills, cold or flu like symptoms, pre-syncope, or syncope.  CV: Denies chest pain, palpitations. Resp: Denies dyspnea at rest, cough. GI: See HPI  Heme: See HPI  Physical Exam: BP 120/76 (BP Location: Right Arm, Patient Position: Sitting, Cuff Size: Normal)   Pulse 76   Temp 97.7 F (36.5 C) (Temporal)   Ht 5' 6 (1.676 m)   Wt 128 lb 6.4 oz (58.2 kg)   LMP 11/27/2011   BMI 20.72 kg/m  General:   Alert and oriented. No distress noted. Pleasant and cooperative.  Head:  Normocephalic and atraumatic. Eyes:  Conjuctiva clear without scleral icterus.Possibly a developing stye on right lower eyelid.  Heart:  S1, S2 present without murmurs  appreciated. Lungs:  Clear to auscultation bilaterally. No wheezes, rales, or rhonchi. No distress.  Abdomen:  +BS, soft, non-tender and non-distended. No rebound or guarding. No HSM or masses noted. Rectal: No external or internal abnormalities. No pain reproduced on rectal exam.  Msk:  Symmetrical without gross deformities. Normal posture. Extremities:  Without edema. Neurologic:  Alert and  oriented x4 Psych:  Normal mood and affect.    Assessment:  48 y.o. female with history of constipation, GERD, presenting today for follow-up of constipation and also reporting intermittent low pelvic/buttock pain.  Constipation: Doing well with MiraLAX daily.  Low pelvic/buttock/rectal pain: Intermittent for the last several months, but seems to be a little more frequent over the last 3 months occurring at least  once a week, lasting seconds to minutes.  Not associated with defecation or urination.  Rectal exam today was normal and did not reproduce her symptoms.  Abdominal exam also benign.  Consider this could be proctalgia fugax, but ideally would pursue further work-up to exclude pelvic/rectal abnormalities. Notably, her last colonoscopy was in March 2023 with nonbleeding internal hemorrhoids, 4 mm sessile serrated polyp, due for routine surveillance in 2028.  We discussed the possibility of abdominal/pelvic imaging and/or flexible sigmoidoscopy, the patient states she would like to think about this and monitor her symptoms for now.   Plan:  Continue MiraLAX daily for constipation. Continue to monitor low pelvic/rectal/buttock pain.  Requested patient to keep a log of this.  She will let us  know if she has any worsening symptoms or decides that she would like to pursue further evaluation. Surveillance colonoscopy in 2028.  She is on recall.   Josette Centers, PA-C [facility] 04/12/2023

## 2023-04-12 ENCOUNTER — Ambulatory Visit: Payer: BC Managed Care – PPO | Admitting: Gastroenterology

## 2023-04-12 ENCOUNTER — Encounter: Payer: Self-pay | Admitting: Gastroenterology

## 2023-04-12 VITALS — BP 120/76 | HR 76 | Temp 97.7°F | Ht 66.0 in | Wt 128.4 lb

## 2023-04-12 DIAGNOSIS — K5900 Constipation, unspecified: Secondary | ICD-10-CM | POA: Diagnosis not present

## 2023-04-12 DIAGNOSIS — Z860101 Personal history of adenomatous and serrated colon polyps: Secondary | ICD-10-CM

## 2023-04-12 DIAGNOSIS — M7918 Myalgia, other site: Secondary | ICD-10-CM

## 2023-04-12 DIAGNOSIS — R102 Pelvic and perineal pain: Secondary | ICD-10-CM | POA: Diagnosis not present

## 2023-04-12 DIAGNOSIS — K6289 Other specified diseases of anus and rectum: Secondary | ICD-10-CM

## 2023-04-12 DIAGNOSIS — Z8719 Personal history of other diseases of the digestive system: Secondary | ICD-10-CM

## 2023-04-12 NOTE — Patient Instructions (Addendum)
 Continue taking MiraLAX daily as this is working well for constipation.  Continue to monitor the pain that you are experiencing across your buttock intermittently.  It would be helpful if you keep a log of this.  Write down when it occurs, how long last, where the pain is located, if you have had a bowel movement, and anything else around that time that you think may have contributed to see if you can identify a cause.  If your symptoms worsen or you decide that you would like further evaluation, please let me know.  We will follow-up with you as needed as requested.  Josette Centers, PA-C [facility]

## 2023-04-13 ENCOUNTER — Encounter: Payer: Self-pay | Admitting: Gastroenterology

## 2023-04-28 DIAGNOSIS — H52223 Regular astigmatism, bilateral: Secondary | ICD-10-CM | POA: Diagnosis not present

## 2023-04-28 DIAGNOSIS — H5213 Myopia, bilateral: Secondary | ICD-10-CM | POA: Diagnosis not present

## 2023-04-28 DIAGNOSIS — H524 Presbyopia: Secondary | ICD-10-CM | POA: Diagnosis not present

## 2023-08-10 ENCOUNTER — Other Ambulatory Visit: Payer: Self-pay | Admitting: Family

## 2023-08-10 DIAGNOSIS — F411 Generalized anxiety disorder: Secondary | ICD-10-CM

## 2023-08-10 MED ORDER — BUPROPION HCL ER (XL) 150 MG PO TB24: 150.0000 mg | ORAL_TABLET | Freq: Every day | ORAL | 1 refills | Status: DC

## 2023-08-10 NOTE — Telephone Encounter (Signed)
 Needs prior auth   Copied from CRM [id number]. Topic: Clinical - Medication Refill >> Aug 10, 2023  9:56 AM Bearl Botts E wrote: Medication: buPROPion  (WELLBUTRIN  XL) 150 MG 24 hr tablet  Has the patient contacted their pharmacy? Yes (Agent: If no, request that the patient contact the pharmacy for the refill. If patient does not wish to contact the pharmacy document the reason why and proceed with request.) (Agent: If yes, when and what did the pharmacy advise?)  This is the patient's preferred pharmacy:  [id number] - [place], [place] - [address] [place] [address] Phone: [phone] Fax: [phone]  Is this the correct pharmacy for this prescription? Yes If no, delete pharmacy and type the correct one.   Has the prescription been filled recently? Yes  Is the patient out of the medication? No, just a few left   Has the patient been seen for an appointment in the last year OR does the patient have an upcoming appointment? Yes  Can we respond through MyChart? Yes  Agent: Please be advised that Rx refills may take up to 3 business days. We ask that you follow-up with your pharmacy.

## 2023-08-22 ENCOUNTER — Encounter: Payer: Self-pay | Admitting: Family

## 2023-08-22 ENCOUNTER — Ambulatory Visit: Admitting: Family

## 2023-08-22 VITALS — BP 119/75 | HR 66 | Temp 97.8°F | Ht 66.0 in | Wt 130.2 lb

## 2023-08-22 DIAGNOSIS — H0012 Chalazion right lower eyelid: Secondary | ICD-10-CM | POA: Diagnosis not present

## 2023-08-22 DIAGNOSIS — L720 Epidermal cyst: Secondary | ICD-10-CM

## 2023-08-22 MED ORDER — BACITRACIN-POLYMYXIN B 500-10000 UNIT/GM OP OINT
1.0000 | TOPICAL_OINTMENT | Freq: Three times a day (TID) | OPHTHALMIC | 0 refills | Status: DC
Start: 2023-08-22 — End: 2024-01-24

## 2023-08-22 NOTE — Patient Instructions (Signed)
 Chalazion  A chalazion is a swelling or lump on the eyelid. It can affect the upper eyelid or the lower eyelid. What are the causes? This condition may be caused by: Long-lasting (chronic) inflammation of the eyelid glands. A blocked oil gland in the eyelid. What are the signs or symptoms? Symptoms of this condition include: Swelling of the eyelid that: May spread to areas around the eye. May be painful. A hard lump on the eyelid. Blurry vision. The lump may make it hard to see out of the eye. How is this diagnosed? This condition is diagnosed with an examination of the eye. How is this treated? This condition is treated by applying a warm, moist cloth (warm compress) to the eyelid. If the condition does not improve, it may be treated with: Medicine that is applied to the eye. Oral medicines. Medicine that is injected into the chalazion. Surgery. Follow these instructions at home: Managing pain and swelling Apply a warm compress to the eyelid for 10-15 minutes, 4 to 6 times a day. This will help to open any blocked glands and to reduce redness and swelling. Take and apply over-the-counter and prescription medicines only as told by your health care provider. General instructions Do not touch the chalazion. Do not try to remove the pus. Do not squeeze the chalazion or stick it with a pin or needle. Do not rub your eyes. Wash your hands often with soap and water for at least 20 seconds. Dry your hands with a clean towel. Keep your face, scalp, and eyebrows clean. Avoid wearing eye makeup. Keep all follow-up visits. This is important. Contact a health care provider if: Your eyelid is getting worse. You have a fever. The chalazion does not break open (rupture) or go away on its own and your eyelid has not improved for 4 weeks. Get help right away if: You have pain in your eye. Your vision worsens. The chalazion becomes painful or red. The chalazion gets bigger. Summary A  chalazion is a swelling or lump on the upper or lower eyelid. It may be caused by chronic inflammation or a blocked oil gland. Apply a warm compress to the eyelid for 10-15 minutes, 4 to 6 times a day. Keep your face, scalp, and eyebrows clean. This information is not intended to replace advice given to you by your health care provider. Make sure you discuss any questions you have with your health care provider. Document Revised: 05/27/2020 Document Reviewed: 05/27/2020 Elsevier Patient Education  2024 [employer].

## 2023-08-22 NOTE — Progress Notes (Signed)
 Subjective:    Patient ID: [name], female    DOB: 06-Jun-1975, 48 y.o.   MRN: [id number]  Chief Complaint  Patient presents with   Stye   bumps   Pt presents to the office today with a stye on right lower lid that has been there since January. She has been using warm compresses without relief. Her ophthalmologist recommend her doing red light therapy, but states this was too expensive.   She is also complaining of small white cysts on forehead and around eye.  Eye Problem  The right eye is affected. This is a recurrent problem. The current episode started more than 1 month ago. The problem occurs constantly. The problem has been unchanged. The pain is at a severity of 0/10. The patient is experiencing no pain. Pertinent negatives include no blurred vision, eye discharge, double vision, eye redness, fever, foreign body sensation, itching, nausea, photophobia or recent URI. The treatment provided no relief.      Review of Systems  Constitutional:  Negative for fever.  Eyes:  Negative for blurred vision, double vision, photophobia, discharge, redness and itching.  Gastrointestinal:  Negative for nausea.  All other systems reviewed and are negative.   Social History   Socioeconomic History   Marital status: Married    Spouse name: Not on file   Number of children: 1   Years of education: Not on file   Highest education level: Not on file  Occupational History   Occupation: stay at home mom  Tobacco Use   Smoking status: Never   Smokeless tobacco: Never  Substance and Sexual Activity   Alcohol use: Yes    Alcohol/week: 1.0 standard drink of alcohol    Types: 1 Standard drinks or equivalent per week    Comment: rare etoh use   Drug use: No   Sexual activity: Yes    Partners: Male    Comment: Hysterectomy  Other Topics Concern   Not on file  Social History Narrative   Not on file   Social Drivers of Health   Financial Resource Strain: Not on file  Food  Insecurity: Not on file  Transportation Needs: Not on file  Physical Activity: Not on file  Stress: Not on file  Social Connections: Not on file   Family History  Problem Relation Age of Onset   Cancer Father        lung cancer, deceased   Cancer Mother        brain cancer, deceased   Heart disease Maternal Grandmother    Heart disease Paternal Grandfather        CHF    Diabetes Paternal Grandfather    Stroke Paternal Grandfather    Emphysema Paternal Grandmother    Diabetes Maternal Grandfather    Leukemia Paternal Uncle    Colon cancer Neg Hx         Objective:   Physical Exam Vitals reviewed.  Constitutional:      General: She is not in acute distress.    Appearance: She is well-developed.  HENT:     Head: Normocephalic and atraumatic.     Right Ear: Tympanic membrane normal.     Left Ear: Tympanic membrane normal.  Eyes:     General:        Right eye: Hordeolum present.        Left eye: No hordeolum.     Pupils: Pupils are equal, round, and reactive to light.     Comments:  In right lower eye lid small ulcer like area  Neck:     Thyroid : No thyromegaly.  Cardiovascular:     Rate and Rhythm: Normal rate and regular rhythm.     Heart sounds: Normal heart sounds. No murmur heard. Pulmonary:     Effort: Pulmonary effort is normal. No respiratory distress.     Breath sounds: Normal breath sounds. No wheezing.  Abdominal:     General: Bowel sounds are normal. There is no distension.     Palpations: Abdomen is soft.     Tenderness: There is no abdominal tenderness.  Musculoskeletal:        General: No tenderness. Normal range of motion.     Cervical back: Normal range of motion and neck supple.  Skin:    General: Skin is warm and dry.     Comments: Small milia on forehead, around eyes  Neurological:     Mental Status: She is alert and oriented to person, place, and time.     Cranial Nerves: No cranial nerve deficit.     Deep Tendon Reflexes: Reflexes are  normal and symmetric.  Psychiatric:        Behavior: Behavior normal.        Thought Content: Thought content normal.        Judgment: Judgment normal.       BP 119/75   Pulse 66   Temp 97.8 F (36.6 C) (Temporal)   Ht 5\' 6"  (1.676 m)   Wt 130 lb 3.2 oz (59.1 kg)   LMP 11/27/2011   BMI 21.01 kg/m      Assessment & Plan:  [name] comes in today with chief complaint of Stye and bumps   Diagnosis and orders addressed:  1. Chalazion of right lower eyelid (Primary) Will give polysporin to try  Warm compression Recommend follow up with Ophthalmologists  - Ambulatory referral to Ophthalmology - bacitracin-polymyxin b (POLYSPORIN) ophthalmic ointment; Place 1 Application into the right eye 3 (three) times daily. apply to eye every 12 hours while awake  Dispense: 3.5 g; Refill: 0  2. Milia Can use small needle and remove milia on forehead.       Tommas Fragmin, FNP

## 2023-10-02 DIAGNOSIS — Z1231 Encounter for screening mammogram for malignant neoplasm of breast: Secondary | ICD-10-CM | POA: Diagnosis not present

## 2023-10-02 LAB — HM MAMMOGRAPHY

## 2023-10-23 DIAGNOSIS — D492 Neoplasm of unspecified behavior of bone, soft tissue, and skin: Secondary | ICD-10-CM | POA: Diagnosis not present

## 2023-10-31 DIAGNOSIS — D069 Carcinoma in situ of cervix, unspecified: Secondary | ICD-10-CM | POA: Diagnosis not present

## 2023-10-31 DIAGNOSIS — Z01419 Encounter for gynecological examination (general) (routine) without abnormal findings: Secondary | ICD-10-CM | POA: Diagnosis not present

## 2023-11-07 LAB — HM PAP SMEAR: HPV, high-risk: NEGATIVE

## 2024-01-25 ENCOUNTER — Encounter: Payer: Self-pay | Admitting: Family

## 2024-01-25 ENCOUNTER — Ambulatory Visit: Admitting: Family

## 2024-01-25 VITALS — BP 123/71 | HR 62 | Temp 97.5°F | Ht 66.0 in | Wt 129.4 lb

## 2024-01-25 DIAGNOSIS — Z0001 Encounter for general adult medical examination with abnormal findings: Secondary | ICD-10-CM | POA: Diagnosis not present

## 2024-01-25 DIAGNOSIS — F411 Generalized anxiety disorder: Secondary | ICD-10-CM | POA: Diagnosis not present

## 2024-01-25 DIAGNOSIS — E039 Hypothyroidism, unspecified: Secondary | ICD-10-CM | POA: Diagnosis not present

## 2024-01-25 DIAGNOSIS — Z0000 Encounter for general adult medical examination without abnormal findings: Secondary | ICD-10-CM

## 2024-01-25 DIAGNOSIS — E559 Vitamin D deficiency, unspecified: Secondary | ICD-10-CM

## 2024-01-25 MED ORDER — BUPROPION HCL ER (XL) 150 MG PO TB24: 150.0000 mg | ORAL_TABLET | Freq: Every day | ORAL | 4 refills | Status: AC

## 2024-01-25 NOTE — Progress Notes (Signed)
 Subjective:    [name] ID: [name], female    DOB: 02/18/1976, 48 y.o.   MRN: [id number]  Chief Complaint  [name] presents with   Annual Exam   [name] presents to the office today for CPE without pap and chronic follow up. [name] has seen Cardiologists in the past for palpitations with a negative Holter monitor and ECHO.    [name] is followed by GYN annually. Had colonoscopy completed on 06/22/21. Anxiety Presents for follow-up visit. Symptoms include excessive worry, nervous/anxious behavior and restlessness. Symptoms occur occasionally. The severity of symptoms is moderate.    Thyroid  Problem Presents for follow-up visit. Symptoms include anxiety and constipation. [name] reports no fatigue. The symptoms have been stable.  Constipation This is a chronic problem. The current episode started more than 1 year ago. The problem has been resolved since onset. [name] stool frequency is 1 time per day. [name] has tried laxatives for the symptoms. The treatment provided moderate relief.      Review of Systems  Constitutional:  Negative for fatigue.  Gastrointestinal:  Positive for constipation.  Psychiatric/Behavioral:  The [name] is nervous/anxious.   All other systems reviewed and are negative.  Family History  Problem Relation Age of Onset   Cancer Father        lung cancer, deceased   Cancer Mother        brain cancer, deceased   Heart disease Maternal Grandmother    Heart disease Paternal Grandfather        CHF    Diabetes Paternal Grandfather    Stroke Paternal Grandfather    Emphysema Paternal Grandmother    Diabetes Maternal Grandfather    Leukemia Paternal Uncle    Colon cancer Neg Hx    Social History   Socioeconomic History   Marital status: Married    Spouse name: Not on file   Number of children: 1   Years of education: Not on file   Highest education level: Bachelor's degree (e.g., BA, AB, BS)  Occupational History   Occupation: stay at home mom  Tobacco Use    Smoking status: Never   Smokeless tobacco: Never  Substance and Sexual Activity   Alcohol use: Yes    Alcohol/week: 1.0 standard drink of alcohol    Types: 1 Standard drinks or equivalent per week    Comment: rare etoh use   Drug use: No   Sexual activity: Yes    Partners: Male    Comment: Hysterectomy  Other Topics Concern   Not on file  Social History Narrative   Not on file   Social Drivers of Health   Financial Resource Strain: Low Risk  (01/24/2024)   Overall Financial Resource Strain (CARDIA)    Difficulty of Paying Living Expenses: Not hard at all  Food Insecurity: No Food Insecurity (01/24/2024)   Hunger Vital Sign    Worried About Running Out of Food in the Last Year: Never true    Ran Out of Food in the Last Year: Never true  Transportation Needs: No Transportation Needs (01/24/2024)   PRAPARE - Administrator, Civil Service (Medical): No    Lack of Transportation (Non-Medical): No  Physical Activity: Sufficiently Active (01/24/2024)   Exercise Vital Sign    Days of Exercise per Week: 4 days    Minutes of Exercise per Session: 60 min  Stress: No Stress Concern Present (01/24/2024)   [name] of Occupational Health - Occupational Stress Questionnaire    Feeling of  Stress: Only a little  Social Connections: Moderately Isolated (01/24/2024)   Social Connection and Isolation Panel    Frequency of Communication with Friends and Family: Twice a week    Frequency of Social Gatherings with Friends and Family: Once a week    Attends Religious Services: [name] declined    Database administrator or Organizations: No    Attends Engineer, structural: Not on file    Marital Status: Married       Objective:   Physical Exam Vitals reviewed.  Constitutional:      General: [name] is not in acute distress.    Appearance: [name] is well-developed.  HENT:     Head: Normocephalic and atraumatic.     Right Ear: Tympanic membrane normal.     Left  Ear: Tympanic membrane normal.  Eyes:     Pupils: Pupils are equal, round, and reactive to light.  Neck:     Thyroid : No thyromegaly.  Cardiovascular:     Rate and Rhythm: Normal rate and regular rhythm.     Heart sounds: Normal heart sounds. No murmur heard. Pulmonary:     Effort: Pulmonary effort is normal. No respiratory distress.     Breath sounds: Normal breath sounds. No wheezing.  Abdominal:     General: Bowel sounds are normal. There is no distension.     Palpations: Abdomen is soft.     Tenderness: There is no abdominal tenderness.  Musculoskeletal:        General: No tenderness. Normal range of motion.     Cervical back: Normal range of motion and neck supple.  Skin:    General: Skin is warm and dry.  Neurological:     Mental Status: [name] is alert and oriented to person, place, and time.     Cranial Nerves: No cranial nerve deficit.     Deep Tendon Reflexes: Reflexes are normal and symmetric.  Psychiatric:        Behavior: Behavior normal.        Thought Content: Thought content normal.        Judgment: Judgment normal.      BP 123/71   Pulse 62   Temp (!) 97.5 F (36.4 C) (Temporal)   Ht 5' 6 (1.676 m)   Wt 129 lb 6.4 oz (58.7 kg)   LMP 11/27/2011   SpO2 98%   BMI 20.89 kg/m       Assessment & Plan:  [name] comes in today with chief complaint of Annual Exam   Diagnosis and orders addressed:  1. Annual physical exam (Primary) - CMP14+EGFR - CBC with Differential/Platelet - Lipid panel - TSH  2. Hypothyroidism, unspecified type - CMP14+EGFR - TSH  3. Vitamin D  deficiency - CMP14+EGFR - VITAMIN D  25 Hydroxy (Vit-D Deficiency, Fractures)  4. GAD (generalized anxiety disorder) - CMP14+EGFR - buPROPion  (WELLBUTRIN  XL) 150 MG 24 hr tablet; Take 1 tablet (150 mg total) by mouth daily.  Dispense: 90 tablet; Refill: 4   Labs pending Continue current medications Health Maintenance reviewed Diet and exercise encouraged  Follow up  plan: 1 year    Jade Learn, FNP

## 2024-01-25 NOTE — Patient Instructions (Signed)

## 2024-01-26 ENCOUNTER — Ambulatory Visit: Payer: Self-pay | Admitting: Family

## 2024-01-26 LAB — LIPID PANEL
Chol/HDL Ratio: 3.9 ratio (ref 0.0–4.4)
Cholesterol, Total: 227 mg/dL — ABNORMAL HIGH (ref 100–199)
HDL: 58 mg/dL (ref >39)
LDL Chol Calc (NIH): 146 mg/dL — ABNORMAL HIGH (ref 0–99)
Triglycerides: 128 mg/dL (ref 0–149)
VLDL Cholesterol Cal: 23 mg/dL (ref 5–40)

## 2024-01-26 LAB — CBC WITH DIFFERENTIAL/PLATELET
Basophils Absolute: 0 x10E3/uL (ref 0.0–0.2)
Basos: 1 %
EOS (ABSOLUTE): 0 x10E3/uL (ref 0.0–0.4)
Eos: 1 %
Hematocrit: 41.1 % (ref 34.0–46.6)
Hemoglobin: 13.3 g/dL (ref 11.1–15.9)
Immature Grans (Abs): 0 x10E3/uL (ref 0.0–0.1)
Immature Granulocytes: 0 %
Lymphocytes Absolute: 1.5 x10E3/uL (ref 0.7–3.1)
Lymphs: 29 %
MCH: 31.7 pg (ref 26.6–33.0)
MCHC: 32.4 g/dL (ref 31.5–35.7)
MCV: 98 fL — ABNORMAL HIGH (ref 79–97)
Monocytes Absolute: 0.4 x10E3/uL (ref 0.1–0.9)
Monocytes: 7 %
Neutrophils Absolute: 3.1 x10E3/uL (ref 1.4–7.0)
Neutrophils: 62 %
Platelets: 242 x10E3/uL (ref 150–450)
RBC: 4.2 x10E6/uL (ref 3.77–5.28)
RDW: 12 % (ref 11.7–15.4)
WBC: 5 x10E3/uL (ref 3.4–10.8)

## 2024-01-26 LAB — CMP14+EGFR
ALT: 12 IU/L (ref 0–32)
AST: 19 IU/L (ref 0–40)
Albumin: 4.6 g/dL (ref 3.9–4.9)
Alkaline Phosphatase: 52 IU/L (ref 41–116)
BUN/Creatinine Ratio: 15 (ref 9–23)
BUN: 12 mg/dL (ref 6–24)
Bilirubin Total: <0.2 mg/dL (ref 0.0–1.2)
CO2: 21 mmol/L (ref 20–29)
Calcium: 9.4 mg/dL (ref 8.7–10.2)
Chloride: 102 mmol/L (ref 96–106)
Creatinine, Ser: 0.81 mg/dL (ref 0.57–1.00)
Globulin, Total: 2 g/dL (ref 1.5–4.5)
Glucose: 86 mg/dL (ref 70–99)
Potassium: 4.4 mmol/L (ref 3.5–5.2)
Sodium: 138 mmol/L (ref 134–144)
Total Protein: 6.6 g/dL (ref 6.0–8.5)
eGFR: 89 mL/min/1.73 (ref >59)

## 2024-01-26 LAB — TSH: TSH: 2.78 u[IU]/mL (ref 0.450–4.500)

## 2024-01-26 LAB — VITAMIN D 25 HYDROXY (VIT D DEFICIENCY, FRACTURES): Vit D, 25-Hydroxy: 48.9 ng/mL (ref 30.0–100.0)

## 2024-02-13 DIAGNOSIS — R9230 Dense breasts, unspecified: Secondary | ICD-10-CM | POA: Diagnosis not present

## 2024-03-07 ENCOUNTER — Ambulatory Visit: Payer: Self-pay

## 2024-03-07 NOTE — Telephone Encounter (Signed)
 FYI Only or Action Required?: FYI only for provider: appointment scheduled on 03/12/2024 at 11:55 AM.  Patient was last seen in primary care on 01/25/2024 by Lavell Bari LABOR, FNP.  Called Nurse Triage reporting Hip Pain.  Symptoms began about a month ago.  Interventions attempted: OTC medications: different OTC medication and pain creams and Rest, hydration, or home remedies.  Symptoms are: unchanged.  Triage Disposition: See PCP Within 2 Weeks  Patient/caregiver understands and will follow disposition?: Yes  Copied from CRM #[id number]. Topic: Clinical - Red Word Triage >> Mar 07, 2024 10:14 AM Larissa RAMAN wrote: [facility] that prompted transfer to Nurse Triage: lt hip and back pain Reason for Disposition  [1] MILD pain (e.g., does not interfere with normal activities) AND [2] present > 7 days  Answer Assessment - Initial Assessment Questions Reports mild to moderate left hip pain along with left back pain. Patient states symptoms have been going on for a month  1. LOCATION and RADIATION: Where is the pain located? Does the pain spread (shoot) anywhere else?     Left hip pain that comes from the back and will radiates down the leg 2. QUALITY: What does the pain feel like?  (e.g., sharp, dull, aching, burning)     aching 3. SEVERITY: How bad is the pain? What does it keep you from doing?   (Scale 1-10; or mild, moderate, severe)     Mild to moderate 4. ONSET: When did the pain start? Does it come and go, or is it there all the time?     Started a month ago-constant, pain changes intensity 5. WORK OR EXERCISE: Has there been any recent work or exercise that involved this part of the body?      no 6. CAUSE: What do you think is causing the hip pain?      Concerned for possible sciatica 7. AGGRAVATING FACTORS: What makes the hip pain worse? (e.g., walking, climbing stairs, running)     Sitting in the car for long periods of time, moving left leg in a quick or jerking  movement can cause pain 8. OTHER SYMPTOMS: Do you have any other symptoms? (e.g., back pain, pain shooting down leg,  fever, rash)     Back pain, pain shooting down leg  Protocols used: Hip Pain-A-AH

## 2024-03-07 NOTE — Telephone Encounter (Signed)
 Hip and back pain, off and on for the past month. Not acute. Patient scheduled to see PCP on 12/9 for this.

## 2024-03-12 ENCOUNTER — Encounter: Payer: Self-pay | Admitting: Family

## 2024-03-12 ENCOUNTER — Ambulatory Visit: Admitting: Family

## 2024-03-12 VITALS — BP 115/70 | HR 66 | Temp 97.6°F | Ht 66.0 in | Wt 129.4 lb

## 2024-03-12 DIAGNOSIS — M25552 Pain in left hip: Secondary | ICD-10-CM | POA: Diagnosis not present

## 2024-03-12 DIAGNOSIS — M5432 Sciatica, left side: Secondary | ICD-10-CM | POA: Diagnosis not present

## 2024-03-12 MED ORDER — PREDNISONE 20 MG PO TABS: 40.0000 mg | ORAL_TABLET | Freq: Every day | ORAL | 0 refills | Status: AC

## 2024-03-12 MED ORDER — DICLOFENAC SODIUM 75 MG PO TBEC: 75.0000 mg | DELAYED_RELEASE_TABLET | Freq: Two times a day (BID) | ORAL | 0 refills | Status: AC

## 2024-03-12 NOTE — Patient Instructions (Signed)

## 2024-03-12 NOTE — Progress Notes (Signed)
 Subjective:    Patient ID: [name], female    DOB: Jul 05, 1975, 48 y.o.   MRN: [id number]  Chief Complaint  Patient presents with   Hip Pain   Back Pain   Pt presents to the office today with left hip pain and lower back pain that started 5 weeks ago. Unsure if any injury, but does work out and is very active.  Hip Pain  Associated symptoms include tingling.  Back Pain This is a new problem. The current episode started more than 1 month ago. The problem occurs constantly. The problem has been waxing and waning since onset. The pain is present in the lumbar spine and gluteal. The quality of the pain is described as aching. The pain is at a severity of 3/10. The pain is mild. The symptoms are aggravated by twisting and standing. Associated symptoms include leg pain and tingling. She has tried bed rest, home exercises and NSAIDs for the symptoms. The treatment provided mild relief.      Review of Systems  Musculoskeletal:  Positive for back pain.  Neurological:  Positive for tingling.  All other systems reviewed and are negative.   Social History   Socioeconomic History   Marital status: Married    Spouse name: Not on file   Number of children: 1   Years of education: Not on file   Highest education level: Bachelor's degree (e.g., BA, AB, BS)  Occupational History   Occupation: stay at home mom  Tobacco Use   Smoking status: Never   Smokeless tobacco: Never  Substance and Sexual Activity   Alcohol use: Yes    Alcohol/week: 1.0 standard drink of alcohol    Types: 1 Standard drinks or equivalent per week    Comment: rare etoh use   Drug use: No   Sexual activity: Yes    Partners: Male    Comment: Hysterectomy  Other Topics Concern   Not on file  Social History Narrative   Not on file   Social Drivers of Health   Financial Resource Strain: Low Risk  (01/24/2024)   Overall Financial Resource Strain (CARDIA)    Difficulty of Paying Living Expenses: Not hard at  all  Food Insecurity: No Food Insecurity (01/24/2024)   Hunger Vital Sign    Worried About Running Out of Food in the Last Year: Never true    Ran Out of Food in the Last Year: Never true  Transportation Needs: No Transportation Needs (01/24/2024)   PRAPARE - Administrator, Civil Service (Medical): No    Lack of Transportation (Non-Medical): No  Physical Activity: Sufficiently Active (01/24/2024)   Exercise Vital Sign    Days of Exercise per Week: 4 days    Minutes of Exercise per Session: 60 min  Stress: No Stress Concern Present (01/24/2024)   [name] of Occupational Health - Occupational Stress Questionnaire    Feeling of Stress: Only a little  Social Connections: Moderately Isolated (01/24/2024)   Social Connection and Isolation Panel    Frequency of Communication with Friends and Family: Twice a week    Frequency of Social Gatherings with Friends and Family: Once a week    Attends Religious Services: Patient declined    Database Administrator or Organizations: No    Attends Engineer, Structural: Not on file    Marital Status: Married   Family History  Problem Relation Age of Onset   Cancer Father  lung cancer, deceased   Cancer Mother        brain cancer, deceased   Heart disease Maternal Grandmother    Heart disease Paternal Grandfather        CHF    Diabetes Paternal Grandfather    Stroke Paternal Grandfather    Emphysema Paternal Grandmother    Diabetes Maternal Grandfather    Leukemia Paternal Uncle    Colon cancer Neg Hx         Objective:   Physical Exam Vitals reviewed.  Constitutional:      General: She is not in acute distress.    Appearance: She is well-developed.  HENT:     Head: Normocephalic and atraumatic.     Right Ear: Tympanic membrane normal.     Left Ear: Tympanic membrane normal.  Eyes:     Pupils: Pupils are equal, round, and reactive to light.  Neck:     Thyroid : No thyromegaly.  Cardiovascular:      Rate and Rhythm: Normal rate and regular rhythm.     Heart sounds: Normal heart sounds. No murmur heard. Pulmonary:     Effort: Pulmonary effort is normal. No respiratory distress.     Breath sounds: Normal breath sounds. No wheezing.  Abdominal:     General: Bowel sounds are normal. There is no distension.     Palpations: Abdomen is soft.     Tenderness: There is no abdominal tenderness.  Musculoskeletal:        General: No tenderness. Normal range of motion.     Cervical back: Normal range of motion and neck supple.     Comments: Full ROM of left hip  Skin:    General: Skin is warm and dry.  Neurological:     Mental Status: She is alert and oriented to person, place, and time.     Cranial Nerves: No cranial nerve deficit.     Deep Tendon Reflexes: Reflexes are normal and symmetric.  Psychiatric:        Behavior: Behavior normal.        Thought Content: Thought content normal.        Judgment: Judgment normal.       BP 115/70   Pulse 66   Temp 97.6 F (36.4 C) (Temporal)   Ht 5' 6 (1.676 m)   Wt 129 lb 6.4 oz (58.7 kg)   LMP 11/27/2011   BMI 20.89 kg/m      Assessment & Plan:  [name] comes in today with chief complaint of Hip Pain and Back Pain   Diagnosis and orders addressed:  1. Left hip pain (Primary) - diclofenac  (VOLTAREN ) 75 MG EC tablet; Take 1 tablet (75 mg total) by mouth 2 (two) times daily.  Dispense: 30 tablet; Refill: 0 - predniSONE  (DELTASONE ) 20 MG tablet; Take 2 tablets (40 mg total) by mouth daily with breakfast for 5 days.  Dispense: 10 tablet; Refill: 0  2. Sciatica of left side - diclofenac  (VOLTAREN ) 75 MG EC tablet; Take 1 tablet (75 mg total) by mouth 2 (two) times daily.  Dispense: 30 tablet; Refill: 0 - predniSONE  (DELTASONE ) 20 MG tablet; Take 2 tablets (40 mg total) by mouth daily with breakfast for 5 days.  Dispense: 10 tablet; Refill: 0   Start diclofenac  BID, no other NSAID's ROM exercises discussed, handout given   Start prednisone   Rest Ice Slowly add exercise back after a week    Bari Learn, FNP

## 2024-03-22 ENCOUNTER — Telehealth: Payer: Self-pay | Admitting: Family

## 2024-03-22 NOTE — Telephone Encounter (Unsigned)
 Copied from CRM #[id number]. Topic: Clinical - Medication Question >> Mar 22, 2024  1:17 PM Delon T wrote: Reason for CRM: medications not helping with the pain, asking if she can get a cortisone shot, please call [phone]

## 2024-03-25 ENCOUNTER — Encounter: Payer: Self-pay | Admitting: Family Medicine

## 2024-03-25 ENCOUNTER — Ambulatory Visit: Admitting: Family Medicine

## 2024-03-25 ENCOUNTER — Ambulatory Visit

## 2024-03-25 VITALS — BP 126/76 | HR 68 | Temp 97.9°F | Ht 66.0 in | Wt 126.5 lb

## 2024-03-25 DIAGNOSIS — M5432 Sciatica, left side: Secondary | ICD-10-CM | POA: Diagnosis not present

## 2024-03-25 DIAGNOSIS — M25552 Pain in left hip: Secondary | ICD-10-CM

## 2024-03-25 DIAGNOSIS — M47896 Other spondylosis, lumbar region: Secondary | ICD-10-CM | POA: Diagnosis not present

## 2024-03-25 MED ORDER — METHYLPREDNISOLONE ACETATE 80 MG/ML IJ SUSP
40.0000 mg | Freq: Once | INTRAMUSCULAR | Status: AC
  Administered 2024-03-25: 40 mg via INTRAMUSCULAR

## 2024-03-25 NOTE — Progress Notes (Signed)
 "  Subjective: CC: Low back/ leg pain PCP: Lavell Bari LABOR, FNP YEP:Fzopddj P Huq is a 48 y.o. female presenting to clinic today for:  Patient was seen 03/12/24 for same. She reports onset about 7 weeks ago.  Describes the pain as achy and sometimes sharp.  Mostly in left low back and radiating down left leg, but occasionally, on right side.  She denies any sensory changes, weakness of the left lower extremity.  Typically she is very physically active but notes that she has not been as physically active lately due to concerns that she may exacerbate pain.  She denies any preceding injury.  She has tried stretching, ice, Motrin , Voltaren  and prednisone , which was recently prescribed.  She found some improvement that is temporary with stretching but nothing else is helping besides laying flat.  She reports that after she walked for about an hour with one of her friends she had exacerbation of the symptoms, writing also seems to aggravate it.  She has used an inversion board, TENS unit but again nothing is helping.  Denies any saddle anesthesia, fecal incontinence or urinary retention.  No history of trauma.  She has a friend, who works in an scientist, research (life sciences) office that recommend maybe she see one of their orthopedics.  Her husband, however, had similar symptoms several years ago and he recommended corticosteroid injection so she is hoping that she can have that done today.  At its worst, her pain is 5/10.  She is not really in any pain today.   ROS: Per HPI  Allergies[1] Past Medical History:  Diagnosis Date   Anemia    [facility]   Dysplasia of cervix, low grade (CIN 1)    History of chicken pox    Overactive bladder    MILD   Yeast vaginitis    Current Medications[2] Social History   Socioeconomic History   Marital status: Married    Spouse name: Not on file   Number of children: 1   Years of education: Not on file   Highest education level:  Bachelor's degree (e.g., BA, AB, BS)  Occupational History   Occupation: stay at home mom  Tobacco Use   Smoking status: Never   Smokeless tobacco: Never  Substance and Sexual Activity   Alcohol use: Yes    Alcohol/week: 1.0 standard drink of alcohol    Types: 1 Standard drinks or equivalent per week    Comment: rare etoh use   Drug use: No   Sexual activity: Yes    Partners: Male    Comment: Hysterectomy  Other Topics Concern   Not on file  Social History Narrative   Not on file   Social Drivers of Health   Tobacco Use: Low Risk (03/12/2024)   Patient History    Smoking Tobacco Use: Never    Smokeless Tobacco Use: Never    Passive Exposure: Not on file  Financial Resource Strain: Low Risk (01/24/2024)   Overall Financial Resource Strain (CARDIA)    Difficulty of Paying Living Expenses: Not hard at all  Food Insecurity: No Food Insecurity (01/24/2024)   Epic    Worried About Radiation Protection Practitioner of Food in the Last Year: Never true    Ran Out of Food in the Last Year: Never true  Transportation Needs: No Transportation Needs (01/24/2024)   Epic    Lack of Transportation (Medical): No    Lack of Transportation (Non-Medical): No  Physical Activity: Sufficiently Active (01/24/2024)  Exercise Vital Sign    Days of Exercise per Week: 4 days    Minutes of Exercise per Session: 60 min  Stress: No Stress Concern Present (01/24/2024)   [name] of Occupational Health - Occupational Stress Questionnaire    Feeling of Stress: Only a little  Social Connections: Moderately Isolated (01/24/2024)   Social Connection and Isolation Panel    Frequency of Communication with Friends and Family: Twice a week    Frequency of Social Gatherings with Friends and Family: Once a week    Attends Religious Services: Patient declined    Database Administrator or Organizations: No    Attends Engineer, Structural: Not on file    Marital Status: Married  Catering Manager Violence: Not on  file  Depression (PHQ2-9): Low Risk (08/22/2023)   Depression (PHQ2-9)    PHQ-2 Score: 0  Alcohol Screen: Low Risk (01/24/2024)   Alcohol Screen    Last Alcohol Screening Score (AUDIT): 1  Housing: Low Risk (01/24/2024)   Epic    Unable to Pay for Housing in the Last Year: No    Number of Times Moved in the Last Year: 0    Homeless in the Last Year: No  Utilities: Not on file  Health Literacy: Not on file   Family History  Problem Relation Age of Onset   Cancer Father        lung cancer, deceased   Cancer Mother        brain cancer, deceased   Heart disease Maternal Grandmother    Heart disease Paternal Grandfather        CHF    Diabetes Paternal Grandfather    Stroke Paternal Grandfather    Emphysema Paternal Grandmother    Diabetes Maternal Grandfather    Leukemia Paternal Uncle    Colon cancer Neg Hx     Objective: Office vital signs reviewed. BP 126/76   Pulse 68   Temp 97.9 F (36.6 C)   Ht 5' 6 (1.676 m)   Wt 126 lb 8 oz (57.4 kg)   LMP 11/27/2011   SpO2 100%   BMI 20.42 kg/m   Physical Examination:  General: Awake, alert, nontoxic-appearing female, No acute distress MSK: Ambulating independently with normal gait and station Neuro: 5/5 RLE Strength and 4/5 LLE in hip extension.  She has negative straight leg raise.  Light touch sensation grossly intact, patellar DTRs 2/4 Negative FADIR and negative FABER  Assessment/ Plan: 48 y.o. female   Sciatica of left side - Plan: DG Lumbar Spine 2-3 Views, methylPREDNISolone  acetate (DEPO-MEDROL ) injection 40 mg  Other osteoarthritis of spine, lumbar region   Given persistent symptoms despite corticosteroid burst, oral NSAID and multiple other modalities of conservative treatment, plain films were obtained.  Personal view of plain films demonstrated osteophyte formation at multiple levels in the lumbar spine, best appreciated on the lateral views.  She has slight scoliotic changes noted and possible  anteriorlisthesis of the L4 on L5.  Discussed degenerative changes noted on x-ray.  Formal review by radiology still pending at time of discharge.  I did accommodate her corticosteroid injection though not overly enthusiastic this will be effective given lack of response to corticosteroid burst that was given orally just a couple of weeks ago.  We discussed chiropractic therapy as possible treatment but discussed likelihood that this would take several sessions before she found palpable resolution in symptoms.  Alternatively, physical therapy may be an option and perhaps dry needling would be efficacious  to her.  Would also strongly consider orthopedic evaluation if no significant improvement with either of these other modalities of treatments.  She will follow-up with PCP for ongoing management if needed   Norene CHRISTELLA Fielding, DO [facility] [phone]     [1] No Known Allergies [2]  Current Outpatient Medications:    buPROPion  (WELLBUTRIN  XL) 150 MG 24 hr tablet, Take 1 tablet (150 mg total) by mouth daily., Disp: 90 tablet, Rfl: 4   diclofenac  (VOLTAREN ) 75 MG EC tablet, Take 1 tablet (75 mg total) by mouth 2 (two) times daily., Disp: 30 tablet, Rfl: 0  "

## 2024-04-12 ENCOUNTER — Ambulatory Visit: Payer: Self-pay | Admitting: Family Medicine

## 2024-05-08 ENCOUNTER — Encounter: Payer: Self-pay | Admitting: Orthopedic Surgery

## 2024-05-08 ENCOUNTER — Ambulatory Visit: Admitting: Orthopedic Surgery

## 2024-05-08 VITALS — BP 126/76 | Ht 66.0 in | Wt 126.0 lb

## 2024-05-08 DIAGNOSIS — M25552 Pain in left hip: Secondary | ICD-10-CM

## 2024-05-08 DIAGNOSIS — M51379 Other intervertebral disc degeneration, lumbosacral region without mention of lumbar back pain or lower extremity pain: Secondary | ICD-10-CM

## 2024-05-08 NOTE — Progress Notes (Signed)
 "  Office Visit Note   Patient: [name]           Date of Birth: November 08, 1975           MRN: [id number] Visit Date: 05/08/2024 Requested by: Lavell Bari LABOR, FNP [address] [place],  [place] [address] PCP: Lavell Bari LABOR, FNP   Assessment & Plan:   Encounter Diagnoses  Name Primary?   Pain in left hip Yes   Degenerative disc disease at L5-S1 level     No orders of the defined types were placed in this encounter.   We discussed doing an MRI of her back but when it was made clear that she had not taken anti-inflammatories on any consistent basis I advised her to do that and then we discussed chiropractor versus therapy and both will probably cause for the same amount of money so I recommended she continue therapy.  If no improvement after 6 weeks then MRI would be prudent to evaluate the neural elements of the spine and determine her symptoms source   Subjective: Chief Complaint  Patient presents with   Hip Pain    Left buttock area started few months ago with exercise   Back Pain    HPI: 49 year old female presents with left hip and buttock pain for several months.  She has seen the chiropractor for adjustments and exercises and she has taken ibuprofen  on occasion as well as prednisone  after an urgent care visit however she continues to have pain in her left buttock lower back sometimes radiating into the left groin.  Her symptoms increased with standing and sitting including driving a car              ROS: During or around the pregnancy she had some sciatica which resolved it was also on the left side she currently has no numbness tingling bowel or bladder dysfunction other than constipation   Images personally read and my interpretation : Side images show the spine is leaning to the left and she has some degenerative disc narrowing at L5-S1  Visit Diagnoses:  1. Pain in left hip   2. Degenerative disc disease at L5-S1 level      Follow-Up Instructions:  No follow-ups on file.    Objective: Vital Signs: BP 126/76 Comment: Dec 26 25  Ht 5' 6 (1.676 m)   Wt 126 lb (57.2 kg)   LMP 11/27/2011   BMI 20.34 kg/m   Physical Exam General Appearance thin well-developed well-nourished good grooming and hygiene  Oriented x 3  Somber mood flat affect  Gait and station normal  I checked her flexion extension of her spine it was normal no pain I checked the flexion extension internal/external rotation of both hips normal no pain straight leg raises up to 90 degrees no pain  No palpable pain in the lumbar spine central or right or left or buttock.  I checked her for piriformis irritation and both hips normal  We did pinwheel test reflux testing all normal good pulse perfusion and capillary refill no abnormalities     Specialty Comments:  No specialty comments available.  Imaging: No results found.   PMFS History: Patient Active Problem List   Diagnosis Date Noted   Vitamin D  deficiency 01/25/2024   Palpitations 12/31/2019   GAD (generalized anxiety disorder) 06/28/2019   Hypothyroidism 12/25/2017   Dyspepsia    Abdominal pain, generalized 07/29/2014   Constipation 07/29/2014   s/p TLH 8/28 with benign path - h/o  AIS 12/01/2011   Adenocarcinoma in situ (AIS) of uterine cervix - s/p CKC with neg margins 12/15/10 09/14/2011   Past Medical History:  Diagnosis Date   Anemia    [facility]   Dysplasia of cervix, low grade (CIN 1)    History of chicken pox    Overactive bladder    MILD   Yeast vaginitis     Family History  Problem Relation Age of Onset   Cancer Father        lung cancer, deceased   Cancer Mother        brain cancer, deceased   Heart disease Maternal Grandmother    Heart disease Paternal Grandfather        CHF    Diabetes Paternal Grandfather    Stroke Paternal Grandfather    Emphysema Paternal Grandmother    Diabetes Maternal Grandfather    Leukemia Paternal  Uncle    Colon cancer Neg Hx     Past Surgical History:  Procedure Laterality Date   CERVICAL CONIZATION W/BX  12/15/2010   Procedure: CONIZATION CERVIX WITH BIOPSY;  Surgeon: Jon CINDERELLA Rummer, MD;  Location: [facility];  Service: Gynecology;  Laterality: N/A;   COLONOSCOPY WITH PROPOFOL  N/A 06/22/2021   Procedure: COLONOSCOPY WITH PROPOFOL ;  Surgeon: Cindie Carlin POUR, DO;  Location: [facility];  Service: Endoscopy;  Laterality: N/A;  11:00am, asa 2   ESOPHAGOGASTRODUODENOSCOPY N/A 01/23/2015   SLF: normal esophagus, mild gastritis s/p biopsied (mild chronic gastritis, negative for H. Pylori), normal examined duodenum s/p biopsied (benign).  abdominal pain due to GERD, gastririts, and IBS-C   LAPAROSCOPIC HYSTERECTOMY  11/30/2011   Procedure: HYSTERECTOMY TOTAL LAPAROSCOPIC;  Surgeon: Jon CINDERELLA Rummer, MD;  Location: [facility];  Service: Gynecology;  Laterality: N/A;  with cysto   POLYPECTOMY  06/22/2021   Procedure: POLYPECTOMY;  Surgeon: Cindie Carlin POUR, DO;  Location: [facility];  Service: Endoscopy;;   wisdom teethextraction     Social History   Occupational History   Occupation: stay at home mom  Tobacco Use   Smoking status: Never   Smokeless tobacco: Never  Substance and Sexual Activity   Alcohol use: Yes    Alcohol/week: 1.0 standard drink of alcohol    Types: 1 Standard drinks or equivalent per week    Comment: rare etoh use   Drug use: No   Sexual activity: Yes    Partners: Male    Comment: Hysterectomy       "

## 2024-05-08 NOTE — Progress Notes (Signed)
" °  Intake history:  Chief Complaint  Patient presents with   Hip Pain    Left buttock area started few months ago with exercise   Back Pain     Ht 5' 6 (1.676 m)   Wt 126 lb (57.2 kg)   LMP 11/27/2011   BMI 20.34 kg/m  Body mass index is 20.34 kg/m.  Pharmacy? _____[facility]_  WHAT ARE WE SEEING YOU FOR TODAY?   Back/ into left hip   How long has this bothered you? (DOI?DOS?WS?)  approximately several  month(s) ago  Was there an injury? No  Anticoag.  No   Any ALLERGIES _________Allergies[1] _____________________________________   Treatment:  Have you taken:  Tylenol  No  Advil  Yes  Had PT No  Had injection No  Other  _______took prednisone  and has gone to Chiropractor and has used ibuprofen  a few times_________________        [1] No Known Allergies  "
